# Patient Record
Sex: Female | Born: 2000 | Race: Black or African American | Hispanic: No | Marital: Single
Health system: Southern US, Community
[De-identification: ages and names within clinical notes are randomized; demographics above are authoritative.]

## PROBLEM LIST (undated history)

## (undated) DIAGNOSIS — J45909 Unspecified asthma, uncomplicated: Secondary | ICD-10-CM

## (undated) DIAGNOSIS — F909 Attention-deficit hyperactivity disorder, unspecified type: Secondary | ICD-10-CM

## (undated) DIAGNOSIS — F99 Mental disorder, not otherwise specified: Secondary | ICD-10-CM

## (undated) HISTORY — DX: Mental disorder, not otherwise specified: F99

## (undated) HISTORY — PX: TONSILLECTOMY: SUR1361

---
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
## 2018-03-22 ENCOUNTER — Emergency Department (HOSPITAL_COMMUNITY): Payer: Self-pay

## 2018-03-22 ENCOUNTER — Encounter (HOSPITAL_COMMUNITY): Payer: Self-pay | Admitting: Oncology

## 2018-03-22 ENCOUNTER — Emergency Department (HOSPITAL_COMMUNITY)
Admission: EM | Admit: 2018-03-22 | Discharge: 2018-03-22 | Disposition: A | Discharge status: Home / Self Care | Payer: Self-pay | Attending: Emergency Medicine | Admitting: Emergency Medicine

## 2018-03-22 ENCOUNTER — Other Ambulatory Visit: Payer: Self-pay

## 2018-03-22 DIAGNOSIS — Z7722 Contact with and (suspected) exposure to environmental tobacco smoke (acute) (chronic): Secondary | ICD-10-CM | POA: Insufficient documentation

## 2018-03-22 DIAGNOSIS — J4521 Mild intermittent asthma with (acute) exacerbation: Secondary | ICD-10-CM | POA: Insufficient documentation

## 2018-03-22 MED ORDER — ALBUTEROL SULFATE HFA 108 (90 BASE) MCG/ACT IN AERS
2.0000 | INHALATION_SPRAY | Freq: Once | RESPIRATORY_TRACT | Status: AC
  Administered 2018-03-22: 2 via RESPIRATORY_TRACT
  Filled 2018-03-22: qty 6.7

## 2018-03-22 MED ORDER — PREDNISONE 10 MG PO TABS: 20.0000 mg | ORAL_TABLET | Freq: Every day | ORAL | 0 refills | Status: AC
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
## 2018-03-22 NOTE — ED Notes (Signed)
Patient transported to X-ray 

## 2018-03-22 NOTE — Discharge Instructions (Signed)
Please read instructions below. Use your albuterol inhaler every 4-6 hours as needed for shortness of breath or wheezing.  Starting tomorrow, begin taking the prednisone, as prescribed, until it is gone. Follow up with your primary care provider. Return to the ER if you have shortness of breath not improved by your inhaler, or new or concerning symptoms.  

## 2018-03-22 NOTE — ED Notes (Signed)
Patient verbalizes understanding of medications and discharge instructions. No further questions at this time. VSS and patient ambulatory at discharge.   
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
## 2018-08-09 DIAGNOSIS — R0902 Hypoxemia: Secondary | ICD-10-CM | POA: Diagnosis not present

## 2018-08-09 DIAGNOSIS — J80 Acute respiratory distress syndrome: Secondary | ICD-10-CM | POA: Diagnosis not present

## 2018-08-09 DIAGNOSIS — R069 Unspecified abnormalities of breathing: Secondary | ICD-10-CM | POA: Diagnosis not present

## 2018-08-09 DIAGNOSIS — R0602 Shortness of breath: Secondary | ICD-10-CM | POA: Diagnosis not present

## 2018-09-25 DIAGNOSIS — R52 Pain, unspecified: Secondary | ICD-10-CM | POA: Diagnosis not present

## 2018-09-25 DIAGNOSIS — R0689 Other abnormalities of breathing: Secondary | ICD-10-CM | POA: Diagnosis not present

## 2018-09-25 DIAGNOSIS — J80 Acute respiratory distress syndrome: Secondary | ICD-10-CM | POA: Diagnosis not present

## 2018-09-25 DIAGNOSIS — R0902 Hypoxemia: Secondary | ICD-10-CM | POA: Diagnosis not present

## 2018-09-25 DIAGNOSIS — Z209 Contact with and (suspected) exposure to unspecified communicable disease: Secondary | ICD-10-CM | POA: Diagnosis not present

## 2018-11-19 ENCOUNTER — Encounter (HOSPITAL_COMMUNITY): Payer: Self-pay

## 2018-11-19 ENCOUNTER — Emergency Department (HOSPITAL_COMMUNITY)
Admission: EM | Admit: 2018-11-19 | Discharge: 2018-11-20 | Disposition: A | Discharge status: Home / Self Care | Payer: Self-pay | Attending: Emergency Medicine | Admitting: Emergency Medicine

## 2018-11-19 DIAGNOSIS — Z5321 Procedure and treatment not carried out due to patient leaving prior to being seen by health care provider: Secondary | ICD-10-CM | POA: Insufficient documentation

## 2018-11-19 DIAGNOSIS — J45909 Unspecified asthma, uncomplicated: Secondary | ICD-10-CM | POA: Insufficient documentation

## 2018-11-19 DIAGNOSIS — R0902 Hypoxemia: Secondary | ICD-10-CM | POA: Diagnosis not present

## 2018-11-19 DIAGNOSIS — R0602 Shortness of breath: Secondary | ICD-10-CM | POA: Diagnosis not present

## 2018-11-19 DIAGNOSIS — I10 Essential (primary) hypertension: Secondary | ICD-10-CM | POA: Diagnosis not present

## 2018-11-19 DIAGNOSIS — R000 Tachycardia, unspecified: Secondary | ICD-10-CM | POA: Diagnosis not present

## 2018-11-19 NOTE — ED Notes (Signed)
This tech saw patient leave room and exit lobby.

## 2018-11-19 NOTE — ED Triage Notes (Signed)
Pt comes via [facility], hx of asthma, wheezing in all lobes, PTA received duoneb, 2 gm mag and 125 solumedrol.

## 2018-12-09 ENCOUNTER — Emergency Department (HOSPITAL_COMMUNITY)
Admission: EM | Admit: 2018-12-09 | Discharge: 2018-12-09 | Disposition: A | Discharge status: Home / Self Care | Payer: Self-pay | Attending: Emergency Medicine | Admitting: Emergency Medicine

## 2018-12-09 ENCOUNTER — Other Ambulatory Visit: Payer: Self-pay

## 2018-12-09 ENCOUNTER — Encounter (HOSPITAL_COMMUNITY): Payer: Self-pay

## 2018-12-09 DIAGNOSIS — R457 State of emotional shock and stress, unspecified: Secondary | ICD-10-CM | POA: Diagnosis not present

## 2018-12-09 DIAGNOSIS — J45901 Unspecified asthma with (acute) exacerbation: Secondary | ICD-10-CM | POA: Insufficient documentation

## 2018-12-09 DIAGNOSIS — R069 Unspecified abnormalities of breathing: Secondary | ICD-10-CM | POA: Diagnosis not present

## 2018-12-09 DIAGNOSIS — Z7722 Contact with and (suspected) exposure to environmental tobacco smoke (acute) (chronic): Secondary | ICD-10-CM | POA: Insufficient documentation

## 2018-12-09 DIAGNOSIS — R0689 Other abnormalities of breathing: Secondary | ICD-10-CM | POA: Diagnosis not present

## 2018-12-09 DIAGNOSIS — Z79899 Other long term (current) drug therapy: Secondary | ICD-10-CM | POA: Insufficient documentation

## 2018-12-09 DIAGNOSIS — R0902 Hypoxemia: Secondary | ICD-10-CM | POA: Diagnosis not present

## 2018-12-09 MED ORDER — PREDNISONE 20 MG PO TABS: 60.0000 mg | ORAL_TABLET | Freq: Every day | ORAL | 0 refills | Status: DC

## 2018-12-09 MED ORDER — ALBUTEROL SULFATE HFA 108 (90 BASE) MCG/ACT IN AERS: 1.0000 | INHALATION_SPRAY | Freq: Once | RESPIRATORY_TRACT | Status: DC | PRN

## 2018-12-09 MED ORDER — ALBUTEROL SULFATE HFA 108 (90 BASE) MCG/ACT IN AERS
4.0000 | INHALATION_SPRAY | Freq: Once | RESPIRATORY_TRACT | Status: AC
  Administered 2018-12-09: 4 via RESPIRATORY_TRACT
  Filled 2018-12-09: qty 6.7

## 2018-12-09 MED ORDER — ALBUTEROL SULFATE HFA 108 (90 BASE) MCG/ACT IN AERS
8.0000 | INHALATION_SPRAY | Freq: Once | RESPIRATORY_TRACT | Status: AC
  Administered 2018-12-09: 8 via RESPIRATORY_TRACT
  Filled 2018-12-09: qty 6.7

## 2018-12-09 NOTE — ED Provider Notes (Signed)
[facility] DEPT Provider Note   CSN: [id number] Arrival date & time: 12/09/18  1042     History   Chief Complaint Chief Complaint  Patient presents with  . Asthma    HPI Amanda Glover is a 18 y.o. female.  She has a history of asthma and smokes.  She is complaining of an acute asthma exacerbation that started this morning.  She tried her mom's inhaler and went to work but she felt worse and so an ambulance was called.  She received IV Solu-Medrol prior to arrival, along with 2 g of magnesium and a DuoNeb.  She said she has had asthma flareups before and this is not the worst.  She denies any fever.     The history is provided by the patient and the EMS personnel.  Asthma This is a recurrent problem. The current episode started 3 to 5 hours ago. The problem occurs constantly. The problem has not changed since onset.Associated symptoms include chest pain and shortness of breath. Pertinent negatives include no abdominal pain and no headaches. The symptoms are aggravated by exertion. Nothing relieves the symptoms. She has tried nothing for the symptoms. The treatment provided no relief.    Past Medical History:  Diagnosis Date  . ADHD (attention deficit hyperactivity disorder)   . Asthma     There are no active problems to display for this patient.   No past surgical history on file.   OB History   No obstetric history on file.      Home Medications    Prior to Admission medications   Medication Sig Start Date End Date Taking? Authorizing Provider  albuterol (PROVENTIL HFA;VENTOLIN HFA) 108 (90 BASE) MCG/ACT inhaler Inhale 1-2 puffs into the lungs every 6 (six) hours as needed for wheezing or shortness of breath. 12/21/14   Gloriann Loan, PA-C  loratadine (CLARITIN) 10 MG tablet Take 1 tablet (10 mg total) by mouth daily. 03/18/15   Antonietta Breach, PA-C    Family History No family history on file.  Social History Social History   Tobacco  Use  . Smoking status: Passive Smoke Exposure - Never Smoker  . Smokeless tobacco: Never Used  Substance Use Topics  . Alcohol use: Not Currently  . Drug use: Not Currently     Allergies   Other   Review of Systems Review of Systems  Constitutional: Negative for fever.  HENT: Negative for sore throat.   Eyes: Negative for visual disturbance.  Respiratory: Positive for cough, chest tightness, shortness of breath and wheezing.   Cardiovascular: Positive for chest pain.  Gastrointestinal: Negative for abdominal pain.  Genitourinary: Negative for dysuria.  Musculoskeletal: Negative for neck pain.  Skin: Negative for rash.  Neurological: Negative for headaches.     Physical Exam Updated Vital Signs SpO2 (S) 100%   Physical Exam Vitals signs and nursing note reviewed.  Constitutional:      General: She is not in acute distress.    Appearance: She is well-developed.  HENT:     Head: Normocephalic and atraumatic.  Eyes:     Conjunctiva/sclera: Conjunctivae normal.  Neck:     Musculoskeletal: Neck supple.  Cardiovascular:     Rate and Rhythm: Regular rhythm. Tachycardia present.     Pulses: Normal pulses.     Heart sounds: No murmur.  Pulmonary:     Effort: Tachypnea, accessory muscle usage, prolonged expiration and respiratory distress present.     Breath sounds: Wheezing present.  Abdominal:  Palpations: Abdomen is soft.     Tenderness: There is no abdominal tenderness.  Musculoskeletal: Normal range of motion.     Right lower leg: No edema.     Left lower leg: No edema.  Skin:    General: Skin is warm and dry.     Capillary Refill: Capillary refill takes less than 2 seconds.  Neurological:     General: No focal deficit present.     Mental Status: She is alert.      ED Treatments / Results  Labs (all labs ordered are listed, but only abnormal results are displayed) Labs Reviewed - No data to display  EKG None  Radiology No results found.   Procedures Procedures (including critical care time)  Medications Ordered in ED Medications  albuterol (VENTOLIN HFA) 108 (90 Base) MCG/ACT inhaler 8 puff (8 puffs Inhalation Given 12/09/18 1119)  albuterol (VENTOLIN HFA) 108 (90 Base) MCG/ACT inhaler 4 puff (4 puffs Inhalation Given 12/09/18 1243)     Initial Impression / Assessment and Plan / ED Course  I have reviewed the triage vital signs and the nursing notes.  Pertinent labs & imaging results that were available during my care of the patient were reviewed by me and considered in my medical decision making (see chart for details).    Improved air movement after treatment. DDX asthma, ptx, pneumonia. sats remain good, more comfortable. Will discharge with inhaler and prednisone script. Recommended stopping smoking. Reviewed return instructions.      Final Clinical Impressions(s) / ED Diagnoses   Final diagnoses:  Moderate asthma with exacerbation, unspecified whether persistent    ED Discharge Orders         Ordered    predniSONE (DELTASONE) 20 MG tablet  Daily     12/09/18 1235           Hayden Rasmussen, MD 12/09/18 1927

## 2018-12-09 NOTE — ED Triage Notes (Signed)
Patient arrived via [facility] from home. Patient is AOx4 and ambulatory. Patient chief complaint is Asthma excacerbation that began this morning. Patient used inhaler this morning from mom, went to work and symptoms did not improve but increased. Patient does not have her own individual meds.   Patient received meds by EMS -125mg  Solumedrol IV

## 2018-12-09 NOTE — ED Notes (Signed)
Patient ambulated to restroom without complication or assistance. 

## 2018-12-09 NOTE — Discharge Instructions (Addendum)
You were seen in the emergency department for an asthma exacerbation.  Your symptoms improved with medication here.  Please continue to use the inhaler 2 puffs every 4-6 hours as needed.  4 more days of prednisone.  Please consider stopping smoking.  Return to the emergency department if any worsening symptoms.

## 2019-06-10 DIAGNOSIS — R000 Tachycardia, unspecified: Secondary | ICD-10-CM | POA: Diagnosis not present

## 2019-06-10 DIAGNOSIS — R069 Unspecified abnormalities of breathing: Secondary | ICD-10-CM | POA: Diagnosis not present

## 2019-06-10 DIAGNOSIS — R0602 Shortness of breath: Secondary | ICD-10-CM | POA: Diagnosis not present

## 2019-06-10 DIAGNOSIS — R0902 Hypoxemia: Secondary | ICD-10-CM | POA: Diagnosis not present

## 2019-06-10 DIAGNOSIS — R231 Pallor: Secondary | ICD-10-CM | POA: Diagnosis not present

## 2019-07-23 ENCOUNTER — Encounter (HOSPITAL_COMMUNITY): Payer: Self-pay | Admitting: Pediatrics

## 2019-07-23 ENCOUNTER — Emergency Department (HOSPITAL_COMMUNITY)
Admission: EM | Admit: 2019-07-23 | Discharge: 2019-07-23 | Disposition: A | Discharge status: Home / Self Care | Payer: Self-pay | Attending: Emergency Medicine | Admitting: Emergency Medicine

## 2019-07-23 ENCOUNTER — Emergency Department (HOSPITAL_COMMUNITY): Payer: Self-pay

## 2019-07-23 ENCOUNTER — Other Ambulatory Visit: Payer: Self-pay

## 2019-07-23 DIAGNOSIS — R0602 Shortness of breath: Secondary | ICD-10-CM | POA: Insufficient documentation

## 2019-07-23 DIAGNOSIS — Z3491 Encounter for supervision of normal pregnancy, unspecified, first trimester: Secondary | ICD-10-CM

## 2019-07-23 DIAGNOSIS — J4521 Mild intermittent asthma with (acute) exacerbation: Secondary | ICD-10-CM | POA: Insufficient documentation

## 2019-07-23 DIAGNOSIS — O26891 Other specified pregnancy related conditions, first trimester: Secondary | ICD-10-CM | POA: Insufficient documentation

## 2019-07-23 DIAGNOSIS — Z20822 Contact with and (suspected) exposure to covid-19: Secondary | ICD-10-CM | POA: Insufficient documentation

## 2019-07-23 DIAGNOSIS — Z3A00 Weeks of gestation of pregnancy not specified: Secondary | ICD-10-CM | POA: Insufficient documentation

## 2019-07-23 DIAGNOSIS — Z3201 Encounter for pregnancy test, result positive: Secondary | ICD-10-CM | POA: Insufficient documentation

## 2019-07-23 LAB — I-STAT BETA HCG BLOOD, ED (MC, WL, AP ONLY): I-stat hCG, quantitative: >2000 m[IU]/mL — ABNORMAL HIGH (ref <5)

## 2019-07-23 LAB — SARS CORONAVIRUS 2 BY RT PCR (HOSPITAL ORDER, PERFORMED IN ~~LOC~~ HOSPITAL LAB): SARS Coronavirus 2: NEGATIVE

## 2019-07-23 LAB — POC SARS CORONAVIRUS 2 AG -  ED: SARS Coronavirus 2 Ag: NEGATIVE

## 2019-07-23 MED ORDER — ALBUTEROL SULFATE HFA 108 (90 BASE) MCG/ACT IN AERS
8.0000 | INHALATION_SPRAY | Freq: Once | RESPIRATORY_TRACT | Status: AC
  Administered 2019-07-23: 8 via RESPIRATORY_TRACT
  Filled 2019-07-23: qty 6.7

## 2019-07-23 MED ORDER — PREDNISONE 20 MG PO TABS
20.0000 mg | ORAL_TABLET | Freq: Once | ORAL | Status: AC
  Administered 2019-07-23: 20 mg via ORAL
  Filled 2019-07-23: qty 1

## 2019-07-23 MED ORDER — AEROCHAMBER PLUS FLO-VU LARGE MISC: 1.0000 | Freq: Once | Status: DC

## 2019-07-23 MED ORDER — PREDNISONE 10 MG PO TABS: 20.0000 mg | ORAL_TABLET | Freq: Every day | ORAL | 0 refills | Status: AC

## 2019-07-23 MED ORDER — ALBUTEROL SULFATE HFA 108 (90 BASE) MCG/ACT IN AERS: 2.0000 | INHALATION_SPRAY | Freq: Four times a day (QID) | RESPIRATORY_TRACT | 0 refills | Status: DC | PRN

## 2019-07-23 NOTE — ED Triage Notes (Signed)
C/o shortness of breath x 2 days; along w/ cough. States RX xopenex inhaler has not helped.

## 2019-07-23 NOTE — ED Provider Notes (Signed)
[facility] EMERGENCY DEPARTMENT Provider Note   CSN: [id number] Arrival date & time: 07/23/19  0746     History Chief Complaint  Patient presents with  . Asthma    Amanda Glover is a 19 y.o. female with pertinent past medical history of asthma presents to the ER for evaluation of trouble breathing.  Onset yesterday.  Describes it as her chest feeling tight.  Has associated nasal congestion and scratchy throat.  Her coworker gave her an inhaler called Xopenex 2 months ago because she did not have her own inhaler and she has been using this.  States this inhaler does not seem to work as well as her usual albuterol inhaler.  Every time she takes a puff from her inhaler she starts coughing up phlegm.  Has some wheezing with deep inspiration.  Reports previous asthma flares.  She has had history of hospitalizations as a child for asthma.  Denies intubations.  Patient has not been vaccinated for Covid.  She denies fever, chest pain, loss of taste or smell, nausea, vomiting, abdominal pain, diarrhea, body aches, headaches.  No sick contacts.  No other associate symptoms.  No modifying factors. HPI     Past Medical History:  Diagnosis Date  . ADHD (attention deficit hyperactivity disorder)   . Asthma     There are no problems to display for this patient.   History reviewed. No pertinent surgical history.   OB History   No obstetric history on file.     No family history on file.  Social History   Tobacco Use  . Smoking status: Passive Smoke Exposure - Never Smoker  . Smokeless tobacco: Never Used  Vaping Use  . Vaping Use: Never used  Substance Use Topics  . Alcohol use: Not Currently  . Drug use: Not Currently    Home Medications Prior to Admission medications   Medication Sig Start Date End Date Taking? Authorizing Provider  albuterol (VENTOLIN HFA) 108 (90 Base) MCG/ACT inhaler Inhale 2 puffs into the lungs every 6 (six) hours as needed for wheezing  or shortness of breath. 07/23/19   Kinnie Feil, PA-C  loratadine (CLARITIN) 10 MG tablet Take 1 tablet (10 mg total) by mouth daily. Patient not taking: Reported on 12/09/2018 03/18/15   Antonietta Breach, PA-C  predniSONE (DELTASONE) 10 MG tablet Take 2 tablets (20 mg total) by mouth daily for 4 days. 07/23/19 07/27/19  Kinnie Feil, PA-C    Allergies    Other  Review of Systems   Review of Systems  HENT: Positive for rhinorrhea and sore throat.   Respiratory: Positive for cough, chest tightness, shortness of breath and wheezing.   All other systems reviewed and are negative.   Physical Exam Updated Vital Signs BP 115/82 (BP Location: Right Arm)   Pulse 76   Temp 98.5 F (36.9 C) (Oral)   Resp 15   Ht 5\' 3"  (1.6 m)   SpO2 98%   BMI 32.41 kg/m   Physical Exam Vitals and nursing note reviewed.  Constitutional:      General: She is not in acute distress.    Appearance: She is well-developed.     Comments: NAD.  HENT:     Head: Normocephalic and atraumatic.     Right Ear: External ear normal.     Left Ear: External ear normal.     Nose: Nose normal.     Comments: Nasal turbinates edematous, slightly erythematous.  Clear drainage noted.  Mouth/Throat:     Comments: Minimal erythema on oropharynx.  Otherwise uvula, tonsils and tonsillar pillars normal. Eyes:     General: No scleral icterus.    Conjunctiva/sclera: Conjunctivae normal.  Cardiovascular:     Rate and Rhythm: Normal rate and regular rhythm.     Heart sounds: Normal heart sounds. No murmur heard.   Pulmonary:     Effort: Pulmonary effort is normal.     Breath sounds: Wheezing present.     Comments: Diffuse inspiratory and expiratory wheezing in all fields.  Patient is in no respiratory distress, speaking in full sentences. Musculoskeletal:        General: No deformity. Normal range of motion.     Cervical back: Normal range of motion and neck supple.  Skin:    General: Skin is warm and dry.      Capillary Refill: Capillary refill takes less than 2 seconds.  Neurological:     Mental Status: She is alert and oriented to person, place, and time.  Psychiatric:        Behavior: Behavior normal.        Thought Content: Thought content normal.        Judgment: Judgment normal.     ED Results / Procedures / Treatments   Labs (all labs ordered are listed, but only abnormal results are displayed) Labs Reviewed  I-STAT BETA HCG BLOOD, ED ([facility], [facility], [facility] ONLY) - Abnormal; Notable for the following components:      Result Value   I-stat hCG, quantitative >2,000.0 (*)    All other components within normal limits  SARS CORONAVIRUS 2 BY RT PCR (HOSPITAL ORDER, [facility])  POC SARS CORONAVIRUS 2 AG -  ED    EKG None  Radiology DG Chest 2 View  Result Date: 07/23/2019 CLINICAL DATA:  asthma / shortness of breath x 2 days is EXAM: CHEST - 2 VIEW COMPARISON:  Chest radiograph 03/22/2018 FINDINGS: The heart size and mediastinal contours are within normal limits. Mild thickening of the interstitium bilaterally. No focal infiltrate. No pneumothorax or pleural effusion. The visualized skeletal structures are unremarkable. IMPRESSION: Mild thickening of the interstitium bilaterally as can be seen in reactive airways disease or viral infection. Electronically Signed   By: Audie Pinto M.D.   On: 07/23/2019 09:16    Procedures Procedures (including critical care time)  Medications Ordered in ED Medications  AeroChamber Plus Flo-Vu Large MISC 1 each (has no administration in time range)  albuterol (VENTOLIN HFA) 108 (90 Base) MCG/ACT inhaler 8 puff (8 puffs Inhalation Given 07/23/19 1118)  predniSONE (DELTASONE) tablet 20 mg (20 mg Oral Given 07/23/19 1257)    ED Course  I have reviewed the triage vital signs and the nursing notes.  Pertinent labs & imaging results that were available during my care of the patient were reviewed by me and considered in my medical  decision making (see chart for details).  Clinical Course as of Jul 22 1328  Fri Jul 23, 2019  1033 LMP March ended 23rd  History of irregular periods skipping 1-2 month No pelvic pain or vaginal bleeding, nausea, vomiting First pregnancy     [CG]    Clinical Course User Index [CG] Kinnie Feil, PA-C   MDM Rules/Calculators/A&P                          This patient complains of trouble breathing.  History of asthma as a child  requiring hospitalizations.  I obtained additional history from previous medical records available.  Nursing notes reviewed to obtain more history and assist with MDM.  Patient has been seen in the ER for similar complaints/asthma exacerbations.  Chief complain involves an extensive number of treatment options and is a complaint that carries with it a high risk of complications and morbidity and mortality.  The differential diagnosis includes viral lower respiratory infection including COVID-19, asthma exacerbation, pneumonia.  I ordered medication prednisone, albuterol 8 puffs.  ER work-up including chest x-ray, EKG, pregnancy test ordered at triage.  I ordered, reviewed and personally visualized and interpreted the above labs and/or imaging studies.    ER work up reveals chest x-ray shows reactive airway disease versus viral infection.  hCG is positive.  EKG is nonischemic.  We will add Covid testing.  1215: Rapid Covid is negative.  I have consulted OB pharmacist regarding patient's pregnancy status with asthma flare to assist with medication.  Prednisone has been shown to cross the placenta and cause some congenital malformations like cleft palate however risk is low and untreated asthma exacerbation will lead to worse outcomes.  Risks versus benefits discussed with patient and shared decision making was held.  Ultimately, I recommended prednisone low-dose 20 mg for 5 days as well as albuterol inhaler.  Patient does not have a primary care doctor or  OB/GYN to follow-up with feel if she will benefit from treatment.  She is in agreement with this.  Patient was reevaluated after medicines and albuterol and had improvement in wheezing.  Denies any symptoms.  Appropriate for discharge.  Return precautions discussed.  She was encouraged to follow-up with OB/GYN.  List of medicines that are safe during pregnancy given to her.  Final Clinical Impression(s) / ED Diagnoses Final diagnoses:  Mild intermittent asthma with acute exacerbation  First trimester pregnancy    Rx / DC Orders ED Discharge Orders         Ordered    predniSONE (DELTASONE) 10 MG tablet  Daily     Discontinue  Reprint     07/23/19 1326    albuterol (VENTOLIN HFA) 108 (90 Base) MCG/ACT inhaler  Every 6 hours PRN     Discontinue  Reprint     07/23/19 1327           Kinnie Feil, PA-C 07/23/19 1330    Charlesetta Shanks, MD 07/23/19 1651

## 2019-07-23 NOTE — Discharge Instructions (Addendum)
   You were seen in the ER for cough  Lab work was normal.  Chest x-ray showed either an asthma flare or a virus infection.  Your initial Covid test is negative but we are awaiting the confirmatory test.  This test will back in the next couple of hours.  You may take your MyChart account for the results.  Until then, be cautious about coughing or being around people without a mask.  We will treat your symptoms with low-dose prednisone for a total of 5 days.  Use albuterol inhaler 2 puffs every 4 hours.  You can purchase over-the-counter cough medicine to help with cough.  You can use over-the-counter Flonase nasal spray to help with nasal congestion.  You are approximately 10 to [redacted] weeks pregnant based on your last menstrual period, this is not an exact estimation.  You need to establish care with OB/GYN for routine OB care.  Start taking prenatal vitamins.  Avoid any alcohol, illicit drug use, cigarette use.  Avoid any medicines containing aspirin and ibuprofen.  You can take Tylenol (acetaminophen).  Type of Remedy: Allergy  Safe Medications to Take During Pregnancy  Diphenhydramine (Benadryl) Loratidine (Claritin) Cetirizine (Zyrtec) Type of Remedy: Cold and Flu  Safe Medications to Take During Pregnancy  Diphenhydramine (Benadryl)* Dextromethorphan (Robitussin)* Guaifenesin (Mucinex [plain]) * Vicks Vapor Rub mentholated cream Mentholated or non-mentholated cough drops (Sugar-free cough drops for gestational diabetes should not contain blends of herbs or aspartame) Pseudoephedrine ([Sudafed] after 1st trimester) Acetaminophen (Tylenol)* Saline nasal drops or spray Warm salt/water gargle *Note: Do not take the "SA" (Sustained Action) form of these drugs or the "Multi-Symptom" form of these drugs. Do not use Nyquil due to its high alcohol content.  Type of Remedy: Diarrhea  Safe Medications to Take During Pregnancy  Loperamide ([Imodium] after 1st trimester, for 24  hours only) Type of Remedy: Constipation  Safe Medications to Take During Pregnancy  Methylcellulose fiber (Citrucel) Docusate (Colace) psyllium (Fiberall, Metamucil) polycarbophil (FiberCon) polyethylene glycol (MiraLAX)* *Occasional use only  Type of Remedy: First Aid Ointment  Safe Medications to Take During Pregnancy  Bacitracin Neomycin/polymyxin B/bacitracin (Neosporin) Type of Remedy: Headache  Safe Medications to Take During Pregnancy  Acetaminophen (Tylenol) Type of Remedy: Heartburn  Safe Medications to Take During Pregnancy  Aluminum hydroxide/magnesium carbonate (Gaviscon)* Famotidine (Pepcid AC) Aluminum hydroxide/magnesium hydroxide (Maalox) Calcium carbonate/magnesium carbonate (Mylanta) Calcium carbonate (Titralac, Tums) Ranitidine (Zantac) *Occasional use only  Type of Remedy: Hemorrhoids  Safe Medications to Take During Pregnancy  Phenylephrine/mineral oil/petrolatum (Preparation H) Witch hazel (Tucks pads or ointment) Type of Remedy: Insect repellant  Safe Medications to Take During Pregnancy  N,N-diethyl-meta-toluamide (DEET) Type of Remedy: Nausea and Vomiting  Safe Medications to Take During Pregnancy  Diphenhydramine (Benadryl) Vitamin B6 Type of Remedy: Rashes  Safe Medications to Take During Pregnancy  Diphenhydramine cream (Benadryl) Hydrocortisone cream or ointment Oatmeal bath (Aveeno) Type of Remedy: Sleep  Safe Medications to Take During Pregnancy  Diphenhydramine (Unisom SleepGels, Benadryl) Type of Remedy: Yeast Infection  Safe Medications to Take During Pregnancy  Miconazole (Monistat) *Please note: No drug can be considered 100% safe to use during pregnancy.

## 2019-07-23 NOTE — ED Notes (Signed)
Pt was discharged from ED in NAD at 1330

## 2019-10-18 ENCOUNTER — Encounter: Payer: Self-pay | Admitting: Obstetrics and Gynecology

## 2019-10-19 ENCOUNTER — Encounter: Payer: Self-pay | Admitting: General Practice

## 2019-10-19 ENCOUNTER — Other Ambulatory Visit (HOSPITAL_COMMUNITY)
Admission: RE | Admit: 2019-10-19 | Discharge: 2019-10-19 | Disposition: A | Discharge status: Home / Self Care | Payer: Self-pay | Source: Ambulatory Visit | Attending: Obstetrics and Gynecology | Admitting: Obstetrics and Gynecology

## 2019-10-19 ENCOUNTER — Encounter: Payer: Self-pay | Admitting: Certified Nurse Midwife

## 2019-10-19 ENCOUNTER — Other Ambulatory Visit: Payer: Self-pay

## 2019-10-19 ENCOUNTER — Ambulatory Visit (INDEPENDENT_AMBULATORY_CARE_PROVIDER_SITE_OTHER): Payer: Self-pay | Admitting: Certified Nurse Midwife

## 2019-10-19 VITALS — BP 105/61 | HR 88 | Wt 174.4 lb

## 2019-10-19 DIAGNOSIS — J45909 Unspecified asthma, uncomplicated: Secondary | ICD-10-CM

## 2019-10-19 DIAGNOSIS — Z3A25 25 weeks gestation of pregnancy: Secondary | ICD-10-CM

## 2019-10-19 DIAGNOSIS — Z1331 Encounter for screening for depression: Secondary | ICD-10-CM

## 2019-10-19 DIAGNOSIS — Z3400 Encounter for supervision of normal first pregnancy, unspecified trimester: Secondary | ICD-10-CM | POA: Insufficient documentation

## 2019-10-19 DIAGNOSIS — D509 Iron deficiency anemia, unspecified: Secondary | ICD-10-CM

## 2019-10-19 DIAGNOSIS — O99519 Diseases of the respiratory system complicating pregnancy, unspecified trimester: Secondary | ICD-10-CM

## 2019-10-19 DIAGNOSIS — O99019 Anemia complicating pregnancy, unspecified trimester: Secondary | ICD-10-CM

## 2019-10-19 DIAGNOSIS — O99012 Anemia complicating pregnancy, second trimester: Secondary | ICD-10-CM

## 2019-10-19 DIAGNOSIS — O0932 Supervision of pregnancy with insufficient antenatal care, second trimester: Secondary | ICD-10-CM | POA: Insufficient documentation

## 2019-10-19 DIAGNOSIS — O99512 Diseases of the respiratory system complicating pregnancy, second trimester: Secondary | ICD-10-CM

## 2019-10-19 MED ORDER — PREPLUS 27-1 MG PO TABS: 1.0000 | ORAL_TABLET | Freq: Every day | ORAL | 6 refills | Status: AC

## 2019-10-19 MED ORDER — ALBUTEROL SULFATE HFA 108 (90 BASE) MCG/ACT IN AERS: 2.0000 | INHALATION_SPRAY | Freq: Four times a day (QID) | RESPIRATORY_TRACT | 2 refills | Status: DC | PRN

## 2019-10-19 NOTE — Patient Instructions (Signed)
Glucose Tolerance Test During Pregnancy Why am I having this test? The glucose tolerance test (GTT) is done to check how your body processes sugar (glucose). This is one of several tests used to diagnose diabetes that develops during pregnancy (gestational diabetes mellitus). Gestational diabetes is a temporary form of diabetes that some women develop during pregnancy. It usually occurs during the second trimester of pregnancy and goes away after delivery. Testing (screening) for gestational diabetes usually occurs between 24 and 28 weeks of pregnancy. You may have the GTT test after having a 1-hour glucose screening test if the results from that test indicate that you may have gestational diabetes. You may also have this test if:  You have a history of gestational diabetes.  You have a history of giving birth to very large babies or have experienced repeated fetal loss (stillbirth).  You have signs and symptoms of diabetes, such as: ? Changes in your vision. ? Tingling or numbness in your hands or feet. ? Changes in hunger, thirst, and urination that are not otherwise explained by your pregnancy. What is being tested? This test measures the amount of glucose in your blood at different times during a period of 3 hours. This indicates how well your body is able to process glucose. What kind of sample is taken?  Blood samples are required for this test. They are usually collected by inserting a needle into a blood vessel. How do I prepare for this test?  For 3 days before your test, eat normally. Have plenty of carbohydrate-rich foods.  Follow instructions from your health care provider about: ? Eating or drinking restrictions on the day of the test. You may be asked to not eat or drink anything other than water (fast) starting 8-10 hours before the test. ? Changing or stopping your regular medicines. Some medicines may interfere with this test. Tell a health care provider about:  All  medicines you are taking, including vitamins, herbs, eye drops, creams, and over-the-counter medicines.  Any blood disorders you have.  Any surgeries you have had.  Any medical conditions you have. What happens during the test? First, your blood glucose will be measured. This is referred to as your fasting blood glucose, since you fasted before the test. Then, you will drink a glucose solution that contains a certain amount of glucose. Your blood glucose will be measured again 1, 2, and 3 hours after drinking the solution. This test takes about 3 hours to complete. You will need to stay at the testing location during this time. During the testing period:  Do not eat or drink anything other than the glucose solution.  Do not exercise.  Do not use any products that contain nicotine or tobacco, such as cigarettes and e-cigarettes. If you need help stopping, ask your health care provider. The testing procedure may vary among health care providers and hospitals. How are the results reported? Your results will be reported as milligrams of glucose per deciliter of blood (mg/dL) or millimoles per liter (mmol/L). Your health care provider will compare your results to normal ranges that were established after testing a large group of people (reference ranges). Reference ranges may vary among labs and hospitals. For this test, common reference ranges are:  Fasting: less than 95-105 mg/dL (5.3-5.8 mmol/L).  1 hour after drinking glucose: less than 180-190 mg/dL (10.0-10.5 mmol/L).  2 hours after drinking glucose: less than 155-165 mg/dL (8.6-9.2 mmol/L).  3 hours after drinking glucose: 140-145 mg/dL (7.8-8.1 mmol/L). What do the   results mean? Results within reference ranges are considered normal, meaning that your glucose levels are well-controlled. If two or more of your blood glucose levels are high, you may be diagnosed with gestational diabetes. If only one level is high, your health care  provider may suggest repeat testing or other tests to confirm a diagnosis. Talk with your health care provider about what your results mean. Questions to ask your health care provider Ask your health care provider, or the department that is doing the test:  When will my results be ready?  How will I get my results?  What are my treatment options?  What other tests do I need?  What are my next steps? Summary  The glucose tolerance test (GTT) is one of several tests used to diagnose diabetes that develops during pregnancy (gestational diabetes mellitus). Gestational diabetes is a temporary form of diabetes that some women develop during pregnancy.  You may have the GTT test after having a 1-hour glucose screening test if the results from that test indicate that you may have gestational diabetes. You may also have this test if you have any symptoms or risk factors for gestational diabetes.  Talk with your health care provider about what your results mean. This information is not intended to replace advice given to you by your health care provider. Make sure you discuss any questions you have with your health care provider. Document Revised: 05/14/2018 Document Reviewed: 09/02/2016 Elsevier Patient Education  2020 Elsevier Inc.  

## 2019-10-19 NOTE — Addendum Note (Signed)
Addended by: Orlin Hilding on: 10/19/2019 11:31 AM   Modules accepted: Orders

## 2019-10-19 NOTE — Progress Notes (Signed)
History:   Amanda Glover is a 19 y.o. G1P0000 at [redacted]w[redacted]d by LMP being seen today for her first obstetrical visit.  Her obstetrical history is significant for late for prenatal care. Patient does intend to breast feed. Pregnancy history fully reviewed.  Patient reports no complaints.     HISTORY: OB History  Gravida Para Term Preterm AB Living  1 0 0 0 0 0  SAB TAB Ectopic Multiple Live Births  0 0 0 0 0    # Outcome Date GA Lbr Len/2nd Weight Sex Delivery Anes PTL Lv  1 Current             Patient is <41 yo - no pap needed at this time.   Past Medical History:  Diagnosis Date  . Asthma   . Mental disorder    Past Surgical History:  Procedure Laterality Date  . TONSILLECTOMY     Family History  Problem Relation Age of Onset  . Asthma Mother    Social History   Tobacco Use  . Smoking status: Passive Smoke Exposure - Never Smoker  . Smokeless tobacco: Never Used  Vaping Use  . Vaping Use: Never used  Substance Use Topics  . Alcohol use: Not Currently  . Drug use: Not Currently   Allergies  Allergen Reactions  . Other Swelling    Lima Beans - swelling lips   . Watermelon Flavor    No current outpatient medications on file prior to visit.   No current facility-administered medications on file prior to visit.    Review of Systems Pertinent items noted in HPI and remainder of comprehensive ROS otherwise negative. Physical Exam:   Vitals:   10/19/19 0950  BP: 105/61  Pulse: 88  Weight: 174 lb 6.4 oz (79.1 kg)   Fetal Heart Rate (bpm): 152 Uterus:  Fundal Height: 26 cm  Pelvic Exam: Perineum: no hemorrhoids, normal perineum   Vulva: normal external genitalia, no lesions   Bony Pelvis: average  System: General: well-developed, well-nourished female in no acute distress   Breasts:  normal appearance, no masses or tenderness bilaterally   Skin: normal coloration and turgor, no rashes   Neurologic: oriented, normal, negative, normal mood   Extremities:  normal strength, tone, and muscle mass, ROM of all joints is normal   HEENT PERRLA, extraocular movement intact and sclera clear   Mouth/Teeth mucous membranes moist, pharynx normal without lesions and dental hygiene good   Neck supple and no masses   Cardiovascular: regular rate and rhythm   Respiratory:  no respiratory distress, normal breath sounds   Abdomen: soft, gravid appropriate for gestational age, non-tender; bowel sounds normal; no masses,  no organomegaly    Assessment:    Pregnancy: G1P0000 Patient Active Problem List   Diagnosis Date Noted  . Late prenatal care affecting pregnancy in second trimester 10/19/2019  . Supervision of normal first pregnancy 10/19/2019  . Asthma affecting pregnancy, antepartum 10/19/2019     Plan:    1. Late prenatal care affecting pregnancy in second trimester - Care initiated at 25 weeks - Discussed with patient importance of prenatal care and follow ups, patient verbalizes understanding states she was nervous and in a state of shock but now is feeling better and happy about pregnancy   2. Encounter for supervision of low-risk first pregnancy, antepartum - Welcomed to practice and introduced self to patient  - congratulated on pregnancy  - Reviewed safety, visitor policy, reassurance about COVID-19 for pregnancy at this time. Discussed  possible changes to visits, including televisits, that may occur due to COVID-19.  The office remains open if pt needs to be seen and MAU is open 24 hours/day for OB emergencies. - Routine prenatal care - Anticipatory guidance on upcoming appointments with ultrasound - GC/Chlamydia probe amp ([place])not at [facility] - CBC/D/Plt+RPR+Rh+ABO+Rub Ab... - Culture, OB Urine - CHL AMB BABYSCRIPTS OPT IN - [place] MFM OB COMP + 14 WK; Future - Prenatal Vit-Fe Fumarate-FA (PREPLUS) 27-1 MG TABS; Take 1 tablet by mouth daily.  Dispense: 60 tablet; Refill: 6  3. Asthma affecting pregnancy,  antepartum - patient was seen in ED for asthma exaggeration in June, started on Albuterol inhaler  - Patient reports inhaler is working, last time needed was 4am this morning  - Patient reports that she needs refill, Rx sent to pharmacy of choice  - albuterol (VENTOLIN HFA) 108 (90 Base) MCG/ACT inhaler; Inhale 2 puffs into the lungs every 6 (six) hours as needed for wheezing or shortness of breath.  Dispense: 18 g; Refill: 2  4. [redacted] weeks gestation   Initial labs drawn. Rx prenatal vitamins sent to pharmacy  Problem list reviewed and updated. Genetic Screening discussed, NIPS: ordered. Too late for AFP  Ultrasound discussed; fetal anatomic survey: ordered. Anticipatory guidance about prenatal visits given including labs, ultrasounds, and testing. Discussed usage of Babys[employer]s and virtual visits as additional source of managing and completing prenatal visits in midst of coronavirus and pandemic.   Encouraged to complete MyChart Registration for her ability to review results, send requests, and have questions addressed.  The nature of [place] [facility] with multiple MDs and Advanced Practice Providers was explained to patient; also emphasized that residents, students are part of our team. Routine obstetric precautions reviewed. Encouraged to seek out care at office or emergency room [facility] preferred) for urgent and/or emergent concerns. Return in about 4 weeks (around 11/16/2019) for ROB/GTT.     Lajean Manes, [place] for [employer], [place]

## 2019-10-20 LAB — CBC/D/PLT+RPR+RH+ABO+RUB AB...
Antibody Screen: NEGATIVE
Basophils Absolute: 0.1 10*3/uL (ref 0.0–0.2)
Basos: 1 %
EOS (ABSOLUTE): 0.5 10*3/uL — ABNORMAL HIGH (ref 0.0–0.4)
Eos: 6 %
HCV Ab: <0.1 s/co ratio (ref 0.0–0.9)
HIV Screen 4th Generation wRfx: NONREACTIVE
Hematocrit: 30.6 % — ABNORMAL LOW (ref 34.0–46.6)
Hemoglobin: 10.1 g/dL — ABNORMAL LOW (ref 11.1–15.9)
Hepatitis B Surface Ag: NEGATIVE
Immature Grans (Abs): 0 10*3/uL (ref 0.0–0.1)
Immature Granulocytes: 0 %
Lymphocytes Absolute: 3.4 10*3/uL — ABNORMAL HIGH (ref 0.7–3.1)
Lymphs: 39 %
MCH: 30 pg (ref 26.6–33.0)
MCHC: 33 g/dL (ref 31.5–35.7)
MCV: 91 fL (ref 79–97)
Monocytes Absolute: 0.6 10*3/uL (ref 0.1–0.9)
Monocytes: 6 %
Neutrophils Absolute: 4.3 10*3/uL (ref 1.4–7.0)
Neutrophils: 48 %
Platelets: 240 10*3/uL (ref 150–450)
RBC: 3.37 x10E6/uL — ABNORMAL LOW (ref 3.77–5.28)
RDW: 12.8 % (ref 11.7–15.4)
RPR Ser Ql: NONREACTIVE
Rh Factor: POSITIVE
Rubella Antibodies, IGG: 3.84 index (ref >0.99)
WBC: 8.9 10*3/uL (ref 3.4–10.8)

## 2019-10-20 LAB — HCV INTERPRETATION

## 2019-10-20 LAB — GC/CHLAMYDIA PROBE AMP (~~LOC~~) NOT AT ARMC
Chlamydia: NEGATIVE
Comment: NEGATIVE
Comment: NORMAL
Neisseria Gonorrhea: NEGATIVE

## 2019-10-21 DIAGNOSIS — D509 Iron deficiency anemia, unspecified: Secondary | ICD-10-CM | POA: Insufficient documentation

## 2019-10-21 MED ORDER — FERROUS GLUCONATE 324 (38 FE) MG PO TABS: 324.0000 mg | ORAL_TABLET | Freq: Every day | ORAL | 2 refills | Status: DC

## 2019-10-21 NOTE — Addendum Note (Signed)
Addended by: Lajean Manes on: 10/21/2019 08:27 AM   Modules accepted: Orders

## 2019-11-02 ENCOUNTER — Other Ambulatory Visit: Payer: Self-pay | Admitting: Certified Nurse Midwife

## 2019-11-02 ENCOUNTER — Ambulatory Visit: Payer: Self-pay | Attending: Certified Nurse Midwife

## 2019-11-02 ENCOUNTER — Other Ambulatory Visit: Payer: Self-pay

## 2019-11-02 DIAGNOSIS — Z3400 Encounter for supervision of normal first pregnancy, unspecified trimester: Secondary | ICD-10-CM | POA: Insufficient documentation
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
## 2019-11-03 ENCOUNTER — Other Ambulatory Visit: Payer: Self-pay | Admitting: *Deleted

## 2019-11-03 DIAGNOSIS — D259 Leiomyoma of uterus, unspecified: Secondary | ICD-10-CM

## 2019-11-04 ENCOUNTER — Encounter: Payer: Self-pay | Admitting: *Deleted

## 2019-11-12 ENCOUNTER — Other Ambulatory Visit: Payer: Self-pay | Admitting: General Practice

## 2019-11-12 DIAGNOSIS — Z3403 Encounter for supervision of normal first pregnancy, third trimester: Secondary | ICD-10-CM

## 2019-11-16 ENCOUNTER — Encounter: Payer: Self-pay | Admitting: Student

## 2019-11-16 ENCOUNTER — Other Ambulatory Visit: Payer: Self-pay

## 2019-11-30 ENCOUNTER — Ambulatory Visit: Payer: Self-pay | Attending: Maternal & Fetal Medicine

## 2019-11-30 ENCOUNTER — Ambulatory Visit: Payer: Self-pay

## 2019-12-15 ENCOUNTER — Encounter: Payer: Self-pay | Admitting: *Deleted

## 2019-12-15 ENCOUNTER — Ambulatory Visit: Payer: Self-pay | Attending: Maternal & Fetal Medicine

## 2019-12-15 ENCOUNTER — Other Ambulatory Visit: Payer: Self-pay

## 2019-12-15 ENCOUNTER — Ambulatory Visit: Payer: Self-pay | Admitting: *Deleted

## 2019-12-15 DIAGNOSIS — O99013 Anemia complicating pregnancy, third trimester: Secondary | ICD-10-CM

## 2019-12-15 DIAGNOSIS — O0932 Supervision of pregnancy with insufficient antenatal care, second trimester: Secondary | ICD-10-CM | POA: Insufficient documentation

## 2019-12-15 DIAGNOSIS — O3410 Maternal care for benign tumor of corpus uteri, unspecified trimester: Secondary | ICD-10-CM | POA: Insufficient documentation

## 2019-12-15 DIAGNOSIS — J45909 Unspecified asthma, uncomplicated: Secondary | ICD-10-CM | POA: Insufficient documentation

## 2019-12-15 DIAGNOSIS — Z3A31 31 weeks gestation of pregnancy: Secondary | ICD-10-CM

## 2019-12-15 DIAGNOSIS — O99519 Diseases of the respiratory system complicating pregnancy, unspecified trimester: Secondary | ICD-10-CM

## 2019-12-15 DIAGNOSIS — D649 Anemia, unspecified: Secondary | ICD-10-CM

## 2019-12-15 DIAGNOSIS — D259 Leiomyoma of uterus, unspecified: Secondary | ICD-10-CM | POA: Insufficient documentation

## 2019-12-15 DIAGNOSIS — O99513 Diseases of the respiratory system complicating pregnancy, third trimester: Secondary | ICD-10-CM

## 2020-01-26 ENCOUNTER — Encounter: Payer: Self-pay | Admitting: *Deleted

## 2020-02-02 ENCOUNTER — Other Ambulatory Visit: Payer: Self-pay

## 2020-02-02 ENCOUNTER — Encounter: Payer: Self-pay | Admitting: Obstetrics & Gynecology

## 2020-02-02 ENCOUNTER — Encounter: Payer: Self-pay | Admitting: Family Medicine

## 2020-02-02 ENCOUNTER — Telehealth: Payer: Self-pay | Admitting: Family Medicine

## 2020-02-02 NOTE — Telephone Encounter (Signed)
Attempted to reach patient about her missed appointment. Was not able to leave a voicemail message because her mailbox was not setup.

## 2020-02-03 ENCOUNTER — Telehealth: Payer: Self-pay | Admitting: Family Medicine

## 2020-02-03 NOTE — Telephone Encounter (Signed)
Second attempt to reach patient about her missed OB appointment. Left a voicemail message for her to call the office to get rescheduled.

## 2020-02-05 NOTE — L&D Delivery Note (Addendum)
OB/GYN Faculty Practice Delivery Note  Amanda Glover is a 20 y.o. G1P0000 s/p vaginal delivery at [redacted]w[redacted]d. She was admitted for PROM.   ROM: 15h 47m with moderate meconium fluid GBS Status: Negative Maximum Maternal Temperature: 99.9 F  Labor Progress: . Category 2 tracing with prolonged late decelerations. Low dose pitocin started, progressed to complete dilation.   Delivery Date/Time: 02/12/2020 at 0717 Delivery: Called to room and patient was complete and pushing. Head delivered LOA. No nuchal cord present. Shoulder and body delivered in usual fashion. Infant with spontaneous cry, placed on mother's abdomen, dried and stimulated. Cord clamped x 2 after 1-minute delay, and cut by FOB. Cord blood drawn. Placenta delivered spontaneously, intact, with 3-vessel cord. Fundus firm with massage and Pitocin. Labia, perineum, vagina, and cervix inspected, no laceration found.   Placenta: Spontaneous, intact on 11/57/26 at 2035 Complications: none Lacerations: none EBL: 90 mL Analgesia: Epidural  Postpartum Planning [x]  transfer orders to MB [x]  discharge summary started & shared [x]  message to sent to schedule follow-up  [x]  lists updated [x]  vaccines UTD  Infant: Boy  APGARs 8/9  3320 g   Bluford Main, [place] for [place] 02/12/2020, 8:27 AM  Midwife attestation: I was gloved and present for delivery in its entirety and I agree with the above resident's note. NICU team present for delivery given thick meconium and multiple prolonged decelerations during labor. Infant vigorous at delivery and remained on mother's abdomen.  Fatima Blank, CNM 9:24 AM

## 2020-02-11 ENCOUNTER — Other Ambulatory Visit: Payer: Self-pay

## 2020-02-11 ENCOUNTER — Inpatient Hospital Stay (HOSPITAL_COMMUNITY): Payer: Medicaid Other | Admitting: Anesthesiology

## 2020-02-11 ENCOUNTER — Inpatient Hospital Stay (HOSPITAL_COMMUNITY)
Admission: AD | Admit: 2020-02-11 | Discharge: 2020-02-14 | DRG: 805 | Disposition: A | Discharge status: Home / Self Care | Payer: Medicaid Other | Attending: Obstetrics & Gynecology | Admitting: Obstetrics & Gynecology

## 2020-02-11 ENCOUNTER — Encounter (HOSPITAL_COMMUNITY): Payer: Self-pay | Admitting: Obstetrics and Gynecology

## 2020-02-11 DIAGNOSIS — J45909 Unspecified asthma, uncomplicated: Secondary | ICD-10-CM | POA: Diagnosis not present

## 2020-02-11 DIAGNOSIS — O9902 Anemia complicating childbirth: Secondary | ICD-10-CM | POA: Diagnosis present

## 2020-02-11 DIAGNOSIS — Z7722 Contact with and (suspected) exposure to environmental tobacco smoke (acute) (chronic): Secondary | ICD-10-CM | POA: Diagnosis present

## 2020-02-11 DIAGNOSIS — O4202 Full-term premature rupture of membranes, onset of labor within 24 hours of rupture: Secondary | ICD-10-CM | POA: Diagnosis not present

## 2020-02-11 DIAGNOSIS — Z3400 Encounter for supervision of normal first pregnancy, unspecified trimester: Secondary | ICD-10-CM

## 2020-02-11 DIAGNOSIS — O9852 Other viral diseases complicating childbirth: Secondary | ICD-10-CM | POA: Diagnosis present

## 2020-02-11 DIAGNOSIS — D509 Iron deficiency anemia, unspecified: Secondary | ICD-10-CM | POA: Diagnosis not present

## 2020-02-11 DIAGNOSIS — O9952 Diseases of the respiratory system complicating childbirth: Secondary | ICD-10-CM | POA: Diagnosis present

## 2020-02-11 DIAGNOSIS — O471 False labor at or after 37 completed weeks of gestation: Secondary | ICD-10-CM

## 2020-02-11 DIAGNOSIS — U071 COVID-19: Secondary | ICD-10-CM

## 2020-02-11 DIAGNOSIS — O4292 Full-term premature rupture of membranes, unspecified as to length of time between rupture and onset of labor: Secondary | ICD-10-CM | POA: Diagnosis not present

## 2020-02-11 DIAGNOSIS — O0932 Supervision of pregnancy with insufficient antenatal care, second trimester: Secondary | ICD-10-CM

## 2020-02-11 DIAGNOSIS — Z3A39 39 weeks gestation of pregnancy: Secondary | ICD-10-CM | POA: Diagnosis not present

## 2020-02-11 LAB — WET PREP, GENITAL
Clue Cells Wet Prep HPF POC: NONE SEEN
Sperm: NONE SEEN
Trich, Wet Prep: NONE SEEN
Yeast Wet Prep HPF POC: NONE SEEN

## 2020-02-11 LAB — URINALYSIS, ROUTINE W REFLEX MICROSCOPIC
Bilirubin Urine: NEGATIVE
Glucose, UA: NEGATIVE mg/dL
Ketones, ur: 5 mg/dL — AB
Leukocytes,Ua: NEGATIVE
Nitrite: NEGATIVE
Protein, ur: NEGATIVE mg/dL
Specific Gravity, Urine: 1.016 (ref 1.005–1.030)
pH: 6 (ref 5.0–8.0)

## 2020-02-11 LAB — CBC
HCT: 31.9 % — ABNORMAL LOW (ref 36.0–46.0)
Hemoglobin: 9.6 g/dL — ABNORMAL LOW (ref 12.0–15.0)
MCH: 24.1 pg — ABNORMAL LOW (ref 26.0–34.0)
MCHC: 30.1 g/dL (ref 30.0–36.0)
MCV: 80.2 fL (ref 80.0–100.0)
Platelets: 257 10*3/uL (ref 150–400)
RBC: 3.98 MIL/uL (ref 3.87–5.11)
RDW: 15.3 % (ref 11.5–15.5)
WBC: 6.3 10*3/uL (ref 4.0–10.5)
nRBC: 0 % (ref 0.0–0.2)

## 2020-02-11 LAB — RESP PANEL BY RT-PCR (FLU A&B, COVID) ARPGX2
Influenza A by PCR: NEGATIVE
Influenza B by PCR: NEGATIVE
SARS Coronavirus 2 by RT PCR: POSITIVE — AB

## 2020-02-11 LAB — GROUP B STREP BY PCR: Group B strep by PCR: NEGATIVE

## 2020-02-11 LAB — TYPE AND SCREEN
ABO/RH(D): B POS
Antibody Screen: NEGATIVE

## 2020-02-11 LAB — HEMOGLOBIN A1C
Hgb A1c MFr Bld: 5.6 % (ref 4.8–5.6)
Mean Plasma Glucose: 114.02 mg/dL

## 2020-02-11 MED ORDER — LACTATED RINGERS IV SOLN
500.0000 mL | Freq: Once | INTRAVENOUS | Status: AC
  Administered 2020-02-11: 500 mL via INTRAVENOUS

## 2020-02-11 MED ORDER — SOD CITRATE-CITRIC ACID 500-334 MG/5ML PO SOLN
30.0000 mL | ORAL | Status: DC | PRN
  Filled 2020-02-11: qty 15

## 2020-02-11 MED ORDER — SODIUM CHLORIDE (PF) 0.9 % IJ SOLN
INTRAMUSCULAR | Status: DC | PRN
  Administered 2020-02-11: 12 mL/h via EPIDURAL

## 2020-02-11 MED ORDER — LACTATED RINGERS IV SOLN: INTRAVENOUS | Status: DC

## 2020-02-11 MED ORDER — EPHEDRINE 5 MG/ML INJ: 10.0000 mg | INTRAVENOUS | Status: DC | PRN

## 2020-02-11 MED ORDER — ACETAMINOPHEN 325 MG PO TABS: 650.0000 mg | ORAL_TABLET | ORAL | Status: DC | PRN

## 2020-02-11 MED ORDER — PHENYLEPHRINE 40 MCG/ML (10ML) SYRINGE FOR IV PUSH (FOR BLOOD PRESSURE SUPPORT)
80.0000 ug | PREFILLED_SYRINGE | INTRAVENOUS | Status: DC | PRN
  Filled 2020-02-11: qty 10

## 2020-02-11 MED ORDER — FENTANYL CITRATE (PF) 100 MCG/2ML IJ SOLN
100.0000 ug | INTRAMUSCULAR | Status: DC | PRN
  Administered 2020-02-11: 100 ug via INTRAVENOUS
  Filled 2020-02-11: qty 2

## 2020-02-11 MED ORDER — ONDANSETRON HCL 4 MG/2ML IJ SOLN
4.0000 mg | Freq: Four times a day (QID) | INTRAMUSCULAR | Status: DC | PRN
  Administered 2020-02-12: 4 mg via INTRAVENOUS
  Filled 2020-02-11: qty 2

## 2020-02-11 MED ORDER — OXYCODONE-ACETAMINOPHEN 5-325 MG PO TABS: 1.0000 | ORAL_TABLET | ORAL | Status: DC | PRN

## 2020-02-11 MED ORDER — OXYTOCIN BOLUS FROM INFUSION
333.0000 mL | Freq: Once | INTRAVENOUS | Status: AC
  Administered 2020-02-12: 333 mL via INTRAVENOUS

## 2020-02-11 MED ORDER — LIDOCAINE HCL (PF) 1 % IJ SOLN
INTRAMUSCULAR | Status: DC | PRN
  Administered 2020-02-11: 5 mL via EPIDURAL

## 2020-02-11 MED ORDER — DIPHENHYDRAMINE HCL 50 MG/ML IJ SOLN: 12.5000 mg | INTRAMUSCULAR | Status: DC | PRN

## 2020-02-11 MED ORDER — PHENYLEPHRINE 40 MCG/ML (10ML) SYRINGE FOR IV PUSH (FOR BLOOD PRESSURE SUPPORT)
80.0000 ug | PREFILLED_SYRINGE | INTRAVENOUS | Status: DC | PRN
  Administered 2020-02-12 (×2): 80 ug via INTRAVENOUS

## 2020-02-11 MED ORDER — LACTATED RINGERS IV SOLN
500.0000 mL | INTRAVENOUS | Status: DC | PRN
  Administered 2020-02-11: 500 mL via INTRAVENOUS
  Administered 2020-02-12: 1000 mL via INTRAVENOUS

## 2020-02-11 MED ORDER — FENTANYL-BUPIVACAINE-NACL 0.5-0.125-0.9 MG/250ML-% EP SOLN
12.0000 mL/h | EPIDURAL | Status: DC | PRN
Start: 2020-02-11 — End: 2020-02-12
  Filled 2020-02-11: qty 250

## 2020-02-11 MED ORDER — LIDOCAINE HCL (PF) 1 % IJ SOLN: 30.0000 mL | INTRAMUSCULAR | Status: DC | PRN

## 2020-02-11 MED ORDER — TERBUTALINE SULFATE 1 MG/ML IJ SOLN
INTRAMUSCULAR | Status: AC
  Administered 2020-02-11: 0.25 mg
  Filled 2020-02-11: qty 1

## 2020-02-11 MED ORDER — OXYTOCIN-SODIUM CHLORIDE 30-0.9 UT/500ML-% IV SOLN
2.5000 [IU]/h | INTRAVENOUS | Status: DC
  Filled 2020-02-11: qty 500

## 2020-02-11 MED ORDER — TERBUTALINE SULFATE 1 MG/ML IJ SOLN
0.2500 mg | Freq: Once | INTRAMUSCULAR | Status: AC
  Administered 2020-02-11: 0.25 mg via SUBCUTANEOUS

## 2020-02-11 MED ORDER — OXYCODONE-ACETAMINOPHEN 5-325 MG PO TABS: 2.0000 | ORAL_TABLET | ORAL | Status: DC | PRN

## 2020-02-11 NOTE — Progress Notes (Signed)
Amanda Glover is a 20 y.o. G1P0000 at [redacted]w[redacted]d by second trimester ultrasound admitted for rupture of membranes   Subjective: Pt comfortable with epidural. Family member in room for support.  Objective: BP 116/74   Pulse 81   Temp 97.9 F (36.6 C) (Oral)   Resp 16   Ht 5\' 4"  (1.626 m)   Wt 90.7 kg   LMP 04/27/2019 (Exact Date)   SpO2 100%   BMI 34.33 kg/m  No intake/output data recorded. No intake/output data recorded.  FHT:  FHR: 125 bpm, variability: moderate,  accelerations:  Abscent,  decelerations:  Present prolonged decels x 2-4 minutes occuring repeatedly, with moderate variability and even 10 x 10 accles in between.   UC:   Irregular, difficult to assess with Toco at time of evaluation SVE:   Dilation: 5.5 Effacement (%): 80 Station: 0 Exam by:: Jasmine SCM FSE placed unsuccessfully x 1 by Bluford Main, SNM, and second FSE also unsuccessful by CNM.  Dr Roselie Awkward to room to evaluate, FHT heard by external monitor at 115 with 125 during scalp stimulation, and FSE placed successfully by Dr Roselie Awkward.  IUPC placed by Bluford Main, SNM. Pt tolerated procedures well.  Labs: Lab Results  Component Value Date   WBC 6.3 02/11/2020   HGB 9.6 (L) 02/11/2020   HCT 31.9 (L) 02/11/2020   MCV 80.2 02/11/2020   PLT 257 02/11/2020    Assessment / Plan: Spontaneous labor, progressing normally  Labor: Progressing normally Preeclampsia:  n/a Fetal Wellbeing:  Category II Pain Control:  Epidural I/D:  GBS neg Anticipated MOD:  undetermined  Fatima Blank 02/11/2020, 10:39 PM

## 2020-02-11 NOTE — Anesthesia Preprocedure Evaluation (Signed)
Anesthesia Evaluation  Patient identified by MRN, date of birth, ID band Patient awake    Reviewed: Allergy & Precautions, NPO status , Patient's Chart, lab work & pertinent test results  Airway Mallampati: II  TM Distance: >3 FB Neck ROM: Full    Dental no notable dental hx. (+) Teeth Intact, Dental Advisory Given   Pulmonary asthma ,  Covid +   Pulmonary exam normal breath sounds clear to auscultation       Cardiovascular Exercise Tolerance: Good negative cardio ROS Normal cardiovascular exam Rhythm:Regular Rate:Normal     Neuro/Psych PSYCHIATRIC DISORDERS negative neurological ROS     GI/Hepatic negative GI ROS, Neg liver ROS,   Endo/Other  negative endocrine ROS  Renal/GU negative Renal ROS     Musculoskeletal negative musculoskeletal ROS (+)   Abdominal   Peds  Hematology  (+) anemia , hgb 9.6 Plt 257   Anesthesia Other Findings   Reproductive/Obstetrics (+) Pregnancy                             Anesthesia Physical Anesthesia Plan  ASA: III  Anesthesia Plan: Epidural   Post-op Pain Management:    Induction:   PONV Risk Score and Plan:   Airway Management Planned:   Additional Equipment:   Intra-op Plan:   Post-operative Plan:   Informed Consent: I have reviewed the patients History and Physical, chart, labs and discussed the procedure including the risks, benefits and alternatives for the proposed anesthesia with the patient or authorized representative who has indicated his/her understanding and acceptance.       Plan Discussed with:   Anesthesia Plan Comments: (39.5 wk G1P0  for LEA)        Anesthesia Quick Evaluation

## 2020-02-11 NOTE — Anesthesia Procedure Notes (Signed)
Epidural Patient location during procedure: OB Start time: 02/11/2020 8:31 PM End time: 02/11/2020 8:47 PM  Staffing Anesthesiologist: Barnet Glasgow, MD Performed: anesthesiologist   Preanesthetic Checklist Completed: patient identified, IV checked, site marked, risks and benefits discussed, surgical consent, monitors and equipment checked, pre-op evaluation and timeout performed  Epidural Patient position: sitting Prep: DuraPrep and site prepped and draped Patient monitoring: continuous pulse ox and blood pressure Approach: midline Location: L3-L4 Injection technique: LOR air  Needle:  Needle type: Tuohy  Needle gauge: 17 G Needle length: 9 cm and 9 Needle insertion depth: 5 cm cm Catheter type: closed end flexible Catheter size: 19 Gauge Catheter at skin depth: 12 cm Test dose: negative  Assessment Events: blood not aspirated, injection not painful, no injection resistance, no paresthesia and negative IV test  Additional Notes Patient identified. Risks/Benefits/Options discussed with patient including but not limited to bleeding, infection, nerve damage, paralysis, failed block, incomplete pain control, headache, blood pressure changes, nausea, vomiting, reactions to medication both or allergic, itching and postpartum back pain. Confirmed with bedside nurse the patient's most recent platelet count. Confirmed with patient that they are not currently taking any anticoagulation, have any bleeding history or any family history of bleeding disorders. Patient expressed understanding and wished to proceed. All questions were answered. Sterile technique was used throughout the entire procedure. Please see nursing notes for vital signs. Test dose was given through epidural needle and negative prior to continuing to dose epidural or start infusion. Warning signs of high block given to the patient including shortness of breath, tingling/numbness in hands, complete motor block, or any  concerning symptoms with instructions to call for help. Patient was given instructions on fall risk and not to get out of bed. All questions and concerns addressed with instructions to call with any issues. 1 Attempt (S) . Patient tolerated procedure well.

## 2020-02-11 NOTE — H&P (Addendum)
OBSTETRIC ADMISSION HISTORY AND PHYSICAL  Amanda Glover is a 20 y.o. female G1P0000 with IUP at [redacted]w[redacted]d presenting for PROM @1550 . She reports +FMs. No VB, blurry vision, headaches, peripheral edema, or RUQ pain. She plans on breastfeeding. She is unsure on birth control.  Dating: By 25w [place] --->  Estimated Date of Delivery: 02/13/20  Sono:    @[redacted]w[redacted]d , normal anatomy, ceph presentation, 1703g, 28%ile, EFW 3'12  Prenatal History/Complications: - late PNC, 1 visit - anemia - asthma  Past Medical History: Past Medical History:  Diagnosis Date  . Asthma   . Mental disorder     Past Surgical History: Past Surgical History:  Procedure Laterality Date  . TONSILLECTOMY      Obstetrical History: OB History    Gravida  1   Para      Term      Preterm      AB  0   Living        SAB  0   IAB  0   Ectopic  0   Multiple      Live Births              Social History: Social History   Socioeconomic History  . Marital status: Single    Spouse name: Not on file  . Number of children: Not on file  . Years of education: Not on file  . Highest education level: Not on file  Occupational History  . Not on file  Tobacco Use  . Smoking status: Passive Smoke Exposure - Never Smoker  . Smokeless tobacco: Never Used  Vaping Use  . Vaping Use: Never used  Substance and Sexual Activity  . Alcohol use: Not Currently  . Drug use: Not Currently  . Sexual activity: Yes    Birth control/protection: None  Other Topics Concern  . Not on file  Social History Narrative  . Not on file   Social Determinants of Health   Financial Resource Strain: Not on file  Food Insecurity: Not on file  Transportation Needs: Not on file  Physical Activity: Not on file  Stress: Not on file  Social Connections: Not on file    Family History: Family History  Problem Relation Age of Onset  . Asthma Mother     Allergies: Allergies  Allergen Reactions  . Other Swelling    [place] Beans  - swelling lips   . Watermelon Flavor     Medications Prior to Admission  Medication Sig Dispense Refill Last Dose  . Prenatal Vit-Fe Fumarate-FA (PREPLUS) 27-1 MG TABS Take 1 tablet by mouth daily. 60 tablet 6 Past Week at Unknown time  . albuterol (VENTOLIN HFA) 108 (90 Base) MCG/ACT inhaler Inhale 2 puffs into the lungs every 6 (six) hours as needed for wheezing or shortness of breath. 18 g 2 More than a month at Unknown time  . ferrous gluconate (FERGON) 324 MG tablet Take 1 tablet (324 mg total) by mouth daily with breakfast. 60 tablet 2      Review of Systems:  All systems reviewed and negative except as stated in HPI  PE: Blood pressure 115/82, pulse 81, temperature 98.7 F (37.1 C), resp. rate 18, height 5\' 4"  (1.626 m), weight 91.1 kg, last menstrual period 04/27/2019. General appearance: alert, cooperative and no distress Lungs: regular rate and effort Heart: regular rate  Abdomen: soft, non-tender Extremities: Homans sign is negative, no sign of DVT EFM: 120 bpm, mod variability, + accels, no decels Toco: irregular Dilation:  1.5 Effacement (%): 70 Station: -3 Exam by:: Therisa Doyne, RN  Prenatal labs: ABO, Rh: --/--/PENDING (01/07 1619) Antibody: PENDING (01/07 1619) Rubella: 3.84 (09/14 1028) RPR: Non Reactive (09/14 1028)  HBsAg: Negative (09/14 1028)  HIV: Non Reactive (09/14 1028)  GBS:  pending 2 hr GTT not done  Prenatal Transfer Tool  Maternal Diabetes: No Genetic Screening: Normal Maternal Ultrasounds/Referrals: Normal Fetal Ultrasounds or other Referrals:  None Maternal Substance Abuse:  No Significant Maternal Medications:  None Significant Maternal Lab Results: None  Results for orders placed or performed during the hospital encounter of 02/11/20 (from the past 24 hour(s))  Wet prep, genital   Collection Time: 02/11/20  1:53 PM   Specimen: PATH Cytology Cervicovaginal Ancillary Only  Result Value Ref Range   Yeast Wet Prep HPF POC NONE  SEEN NONE SEEN   Trich, Wet Prep NONE SEEN NONE SEEN   Clue Cells Wet Prep HPF POC NONE SEEN NONE SEEN   WBC, Wet Prep HPF POC MODERATE (A) NONE SEEN   Sperm NONE SEEN   Urinalysis, Routine w reflex microscopic PATH Cytology Cervicovaginal Ancillary Only   Collection Time: 02/11/20  1:53 PM  Result Value Ref Range   Color, Urine AMBER (A) YELLOW   APPearance HAZY (A) CLEAR   Specific Gravity, Urine 1.016 1.005 - 1.030   pH 6.0 5.0 - 8.0   Glucose, UA NEGATIVE NEGATIVE mg/dL   Hgb urine dipstick SMALL (A) NEGATIVE   Bilirubin Urine NEGATIVE NEGATIVE   Ketones, ur 5 (A) NEGATIVE mg/dL   Protein, ur NEGATIVE NEGATIVE mg/dL   Nitrite NEGATIVE NEGATIVE   Leukocytes,Ua NEGATIVE NEGATIVE   RBC / HPF 0-5 0 - 5 RBC/hpf   WBC, UA 0-5 0 - 5 WBC/hpf   Bacteria, UA RARE (A) NONE SEEN   Squamous Epithelial / LPF 11-20 0 - 5   Mucus PRESENT   Type and screen [place]   Collection Time: 02/11/20  4:19 PM  Result Value Ref Range   ABO/RH(D) PENDING    Antibody Screen PENDING    Sample Expiration      02/14/2020,2359 Performed at [facility], [address] [place], [place] [address]     Patient Active Problem List   Diagnosis Date Noted  . Indication for care in labor or delivery 02/11/2020  . Iron deficiency anemia during pregnancy 10/21/2019  . Late prenatal care affecting pregnancy in second trimester 10/19/2019  . Supervision of normal first pregnancy 10/19/2019  . Asthma affecting pregnancy, antepartum 10/19/2019   Assessment: Amanda Glover is a 20 y.o. G1P0000 at [redacted]w[redacted]d here for PROM at term  1. Labor: latent 2. FWB: Cat I 3. Pain: analgesia prn 4. GBS: pending   Plan: Admit to LD Expectant mngt for now Anticipate SVD   Julianne Handler, CNM  02/11/2020, 5:13 PM

## 2020-02-11 NOTE — Progress Notes (Signed)
FHR decelerations persist with contractions. Contractions Q 1.5-5 minutes, MVUs 100-120.  Terbutaline 0.25 mg [place] given by RN at this time.

## 2020-02-11 NOTE — Progress Notes (Signed)
Labor Progress Note Amanda Glover is a 20 y.o. G1P0000 at [redacted]w[redacted]d presented for PROM at term. Covid positive, asymptomatic.  S:  Feeling more regular ctx, painful. States IV pain med didn't help.  O:  BP 128/82   Pulse 99   Temp 99 F (37.2 C) (Oral)   Resp 18   Ht 5\' 4"  (1.626 m)   Wt 90.7 kg   LMP 04/27/2019 (Exact Date)   BMI 34.33 kg/m  EFM: baseline 135 bpm/ mod variability/ no accels/ occ variable decels  Toco/IUPC: 2-5 SVE: Dilation: 3 Effacement (%): 80 Cervical Position: Posterior Station: -3 Presentation: Vertex Exam by:: [name] cnm Mod MSF  A/P: 20 y.o. G1P0000 [redacted]w[redacted]d  1. Labor: latent 2. FWB: Cat II 3. Pain: analgesia/anesthesia prn  Expectant mngt. Anticipate labor progression and SVD.  Julianne Handler, CNM 6:57 PM

## 2020-02-11 NOTE — MAU Provider Note (Cosign Needed)
S: Ms. Danyelle Brookover is a 20 y.o. G1P0000 at [redacted]w[redacted]d  who presents to MAU today complaining of irregular contractions  since 5am this morning. She denies vaginal bleeding. She endorses LOF - amber colored discharge in one large gush this morning and then small amounts in her underwear today. She reports normal fetal movement.    O: BP (!) 107/51   Pulse 78   Temp (!) 97.3 F (36.3 C) (Oral)   Resp 17   Ht 5\' 4"  (1.626 m)   Wt 200 lb (90.7 kg)   LMP 04/27/2019 (Exact Date)   SpO2 98%   BMI 34.33 kg/m  GENERAL: Well-developed, well-nourished female in no acute distress.  HEAD: Normocephalic, atraumatic.  CHEST: Normal effort of breathing, regular heart rate ABDOMEN: Soft, nontender, gravid  Cervical exam:  Dilation: 1.5 Effacement: thick Cervical position: posterior Station: -3 Presentation: vertex Exam by: Gaylan Gerold, CNM  Not grossly ruptured, off white mucus noted on glove after exam, amber colored discharge (same color as pt's urine) noted on glove  After exam, pt stated she feels like she has a UTI because of discomfort when she urinates.  Fern slide negative, wet prep normal, GC/CT pending and UA normal  Fetal Monitoring: reactive Baseline: 135 Variability: moderate Accelerations: present Decelerations: none Contractions: UI  Initially planned to discharge patient with term labor precautions, but as she stood up to get dressed, she had a strong contraction and experienced an obvious SROM with meconium stained fluid. Report called to labor team, pt admitted to L&D for PROM  Gabriel Carina, CNM

## 2020-02-11 NOTE — MAU Note (Signed)
Pt reports repots she has had ctx off and on since 5am. About q2 min now. Denies any leaking reports a little bloody   show. Good fetal movement felt.

## 2020-02-12 DIAGNOSIS — O4202 Full-term premature rupture of membranes, onset of labor within 24 hours of rupture: Secondary | ICD-10-CM | POA: Diagnosis not present

## 2020-02-12 DIAGNOSIS — Z3A39 39 weeks gestation of pregnancy: Secondary | ICD-10-CM | POA: Diagnosis not present

## 2020-02-12 LAB — RPR: RPR Ser Ql: NONREACTIVE

## 2020-02-12 MED ORDER — TERBUTALINE SULFATE 1 MG/ML IJ SOLN
0.2500 mg | Freq: Once | INTRAMUSCULAR | Status: AC
  Administered 2020-02-12: 0.25 mg via SUBCUTANEOUS

## 2020-02-12 MED ORDER — ONDANSETRON HCL 4 MG PO TABS: 4.0000 mg | ORAL_TABLET | ORAL | Status: DC | PRN

## 2020-02-12 MED ORDER — ACETAMINOPHEN 325 MG PO TABS: 650.0000 mg | ORAL_TABLET | ORAL | Status: DC | PRN

## 2020-02-12 MED ORDER — FERROUS SULFATE 325 (65 FE) MG PO TABS
325.0000 mg | ORAL_TABLET | ORAL | Status: DC
  Administered 2020-02-13: 325 mg via ORAL
  Filled 2020-02-12: qty 1

## 2020-02-12 MED ORDER — ONDANSETRON HCL 4 MG/2ML IJ SOLN: 4.0000 mg | INTRAMUSCULAR | Status: DC | PRN

## 2020-02-12 MED ORDER — PRENATAL MULTIVITAMIN CH
1.0000 | ORAL_TABLET | Freq: Every day | ORAL | Status: DC
  Administered 2020-02-12 – 2020-02-14 (×3): 1 via ORAL
  Filled 2020-02-12 (×3): qty 1

## 2020-02-12 MED ORDER — MEASLES, MUMPS & RUBELLA VAC IJ SOLR: 0.5000 mL | Freq: Once | INTRAMUSCULAR | Status: DC

## 2020-02-12 MED ORDER — TERBUTALINE SULFATE 1 MG/ML IJ SOLN: 0.2500 mg | Freq: Once | INTRAMUSCULAR | Status: DC | PRN

## 2020-02-12 MED ORDER — VITAMIN C 250 MG PO TABS
250.0000 mg | ORAL_TABLET | ORAL | Status: DC
  Filled 2020-02-12: qty 1

## 2020-02-12 MED ORDER — FERROUS SULFATE 325 (65 FE) MG PO TABS: 325.0000 mg | ORAL_TABLET | ORAL | Status: DC

## 2020-02-12 MED ORDER — IBUPROFEN 600 MG PO TABS
600.0000 mg | ORAL_TABLET | Freq: Four times a day (QID) | ORAL | Status: DC
  Administered 2020-02-12 – 2020-02-14 (×9): 600 mg via ORAL
  Filled 2020-02-12 (×10): qty 1

## 2020-02-12 MED ORDER — COCONUT OIL OIL: 1.0000 "application " | TOPICAL_OIL | Status: DC | PRN

## 2020-02-12 MED ORDER — MEDROXYPROGESTERONE ACETATE 150 MG/ML IM SUSP: 150.0000 mg | INTRAMUSCULAR | Status: DC | PRN

## 2020-02-12 MED ORDER — BENZOCAINE-MENTHOL 20-0.5 % EX AERO
1.0000 "application " | INHALATION_SPRAY | CUTANEOUS | Status: DC | PRN
  Filled 2020-02-12: qty 56

## 2020-02-12 MED ORDER — TETANUS-DIPHTH-ACELL PERTUSSIS 5-2.5-18.5 LF-MCG/0.5 IM SUSY: 0.5000 mL | PREFILLED_SYRINGE | Freq: Once | INTRAMUSCULAR | Status: DC

## 2020-02-12 MED ORDER — DIBUCAINE (PERIANAL) 1 % EX OINT
1.0000 | TOPICAL_OINTMENT | CUTANEOUS | Status: DC | PRN
Start: 2020-02-12 — End: 2020-02-14

## 2020-02-12 MED ORDER — SENNOSIDES-DOCUSATE SODIUM 8.6-50 MG PO TABS
2.0000 | ORAL_TABLET | ORAL | Status: DC
  Administered 2020-02-12 – 2020-02-14 (×2): 2 via ORAL
  Filled 2020-02-12 (×3): qty 2

## 2020-02-12 MED ORDER — DIPHENHYDRAMINE HCL 25 MG PO CAPS: 25.0000 mg | ORAL_CAPSULE | Freq: Four times a day (QID) | ORAL | Status: DC | PRN

## 2020-02-12 MED ORDER — OXYTOCIN-SODIUM CHLORIDE 30-0.9 UT/500ML-% IV SOLN
1.0000 m[IU]/min | INTRAVENOUS | Status: DC
  Administered 2020-02-12: 2 m[IU]/min via INTRAVENOUS

## 2020-02-12 MED ORDER — WITCH HAZEL-GLYCERIN EX PADS: 1.0000 "application " | MEDICATED_PAD | CUTANEOUS | Status: DC | PRN

## 2020-02-12 MED ORDER — SIMETHICONE 80 MG PO CHEW: 80.0000 mg | CHEWABLE_TABLET | ORAL | Status: DC | PRN

## 2020-02-12 NOTE — Lactation Note (Addendum)
This note was copied from a baby's chart. Lactation Consultation Note  Patient Name: Amanda Glover [id number] Date: 02/12/2020 Reason for consult: Initial assessment;1st time breastfeeding;Term Age:22 hours Infant is term female infant. Per mom, infant had one void and one stool since birth. Mom feels infant is latching well at the breast most feedings are 5 to 10 minutes. Per mom, she watched videos online about breast feeding, she is active on the [facility] in [facility] and she doesn't have breast pump at home, would like one for home prn. LC did not observe latch at this time, due to mom BF infant for 10 minutes prior to [facility] entering the room. LC discussed hand expression and infant took 52mls of EBM by spoon, mom was doing STS with infant. LC discussed with mom, breast stimulation to keep infant awake to breastfeed longer than 5 minutes, such as : breast compressions, gently stroking infant's neck and shoulder and talking to infant while breastfeeding. Mom understands to BF infant according to hunger cues, 8 to 12+ times within 24 hours, STS. Mom knows to call RN or LC if she has further questions, concerns or if she needs assistance with latching infant at the breast.  LC discussed infant's input and output with parents. Mom made aware of O/P services, breastfeeding support groups, community resources, and our phone # for post-discharge questions.  Maternal Data Formula Feeding for Exclusion: No Has patient been taught Hand Expression?: Yes Does the patient have breastfeeding experience prior to this delivery?: No  Feeding    LATCH Score                   Interventions Interventions: Breast feeding basics reviewed;Skin to skin;Position options;Hand express;Hand pump;Expressed milk  Lactation Tools Discussed/Used WIC Program: Yes Initiated by:: RN will give mom breast pump Date initiated:: 02/12/20   Consult Status Consult Status: Follow-up Date:  02/13/20 Follow-up type: In-patient    Vicente Serene 02/12/2020, 6:28 PM

## 2020-02-12 NOTE — Anesthesia Postprocedure Evaluation (Signed)
Anesthesia Post Note  Patient: Cris Gibby  Procedure(s) Performed: AN AD [place]     Patient location during evaluation: Mother Baby Anesthesia Type: Epidural Level of consciousness: awake and alert Pain management: pain level controlled Vital Signs Assessment: post-procedure vital signs reviewed and stable Respiratory status: spontaneous breathing Cardiovascular status: stable Postop Assessment: no headache, adequate PO intake, no backache, patient able to bend at knees, able to ambulate, epidural receding and no apparent nausea or vomiting Anesthetic complications: no   No complications documented.  Last Vitals:  Vitals:   02/12/20 0935 02/12/20 1046  BP: 123/77 125/72  Pulse: 86 92  Resp: 16 16  Temp: 37.2 C 37.2 C  SpO2: 100% 100%    Last Pain:  Vitals:   02/12/20 1248  TempSrc:   PainSc: 1    Pain Goal: Patients Stated Pain Goal: 2 (02/12/20 1248)                 Birdena Crandall, Velvet Bathe

## 2020-02-12 NOTE — Progress Notes (Addendum)
Patient ID: Amanda Glover, female   DOB: [birth year], 20 y.o.   MRN: [id number]  Called to bedside by RN for prolonged late deceleration. Patient noted in high fowlers position. Patient turned to right lateral position. SVE unchanged at 6/80/-2, performed by Fatima Blank, CNM. FHT has moderate variability, recovers with a baseline of 140 bpm. Fatima Blank consulted Dr. Roselie Awkward.   [place], [place] 02/12/2020 AT 03:05 AM

## 2020-02-12 NOTE — Discharge Summary (Signed)
Postpartum Discharge Summary  Date of Service updated 02/14/20     Patient Name: Amanda Glover DOB: 05-08-2000 MRN: [id number]  Date of admission: 02/11/2020 Delivery date:02/12/2020  Delivering provider: Bluford Main S  Date of discharge: 02/14/2020  Admitting diagnosis: Indication for care in labor or delivery [O75.9] Intrauterine pregnancy: [redacted]w[redacted]d    Secondary diagnosis:  Active Problems:   Late prenatal care affecting pregnancy in second trimester   Supervision of normal first pregnancy   Iron deficiency anemia during pregnancy   Indication for care in labor or delivery   Vaginal delivery   COVID  Additional problems: none    Discharge diagnosis: Term Pregnancy Delivered                                              Post partum procedures:n/a Augmentation: Pitocin Complications: None  Hospital course: Onset of Labor With Vaginal Delivery      20y.o. yo G1P0000 at 343w6das admitted in Active Labor on 02/11/2020. Patient had anlabor course complicated by Category 2 tracing with multiple prolonged decelerations that resolved followed by moderate variablity and  as follows:  Membrane Rupture Time/Date: 3:50 PM ,02/11/2020   Delivery Method:Vaginal, Spontaneous  Episiotomy: None  Lacerations:  None  Patient had an uncomplicated postpartum course.  She is ambulating, tolerating a regular diet, passing flatus, and urinating well. Patient is discharged home in stable condition on 02/14/20.  Newborn Data: Birth date:02/12/2020  Birth time:7:17 AM  Gender:Female  Living status:Living  Apgars:8 ,9  Weight:3320 g   Magnesium Sulfate received: No BMZ received: No Rhophylac:No MMR:No T-DaP:declined Flu: No Transfusion:No  Physical exam  Vitals:   02/12/20 2000 02/13/20 0031 02/13/20 0700 02/13/20 2158  BP: 112/67 (!) 109/59 110/74 116/71  Pulse: 72 75 66 75  Resp: 18 16 18 18   Temp: 98 F (36.7 C) 97.8 F (36.6 C) 97.8 F (36.6 C) 98.2 F (36.8 C)  TempSrc: Oral Oral  Oral Oral  SpO2: 100% 100% 99% 99%  Weight:      Height:       General: alert, cooperative and no distress Lochia: appropriate Uterine Fundus: firm Incision: N/A DVT Evaluation: No evidence of DVT seen on physical exam. Labs: Lab Results  Component Value Date   WBC 6.3 02/11/2020   HGB 9.6 (L) 02/11/2020   HCT 31.9 (L) 02/11/2020   MCV 80.2 02/11/2020   PLT 257 02/11/2020   No flowsheet data found. Edinburgh Score: Edinburgh Postnatal Depression Scale Screening Tool 02/13/2020  I have been able to laugh and see the funny side of things. 0  I have looked forward with enjoyment to things. 0  I have blamed myself unnecessarily when things went wrong. 1  I have been anxious or worried for no good reason. 1  I have felt scared or panicky for no good reason. 1  Things have been getting on top of me. 1  I have been so unhappy that I have had difficulty sleeping. 0  I have felt sad or miserable. 0  I have been so unhappy that I have been crying. 0  The thought of harming myself has occurred to me. 0  Edinburgh Postnatal Depression Scale Total 4     After visit meds:  Allergies as of 02/14/2020      Reactions   Other Swelling   [place]ima Beans -  swelling lips   Watermelon Flavor       Medication List    TAKE these medications   acetaminophen 500 MG tablet Commonly known as: TYLENOL Take 2 tablets (1,000 mg total) by mouth every 6 (six) hours as needed (for pain scale < 4).   albuterol 108 (90 Base) MCG/ACT inhaler Commonly known as: VENTOLIN HFA Inhale 2 puffs into the lungs every 6 (six) hours as needed for wheezing or shortness of breath.   ascorbic acid 250 MG tablet Commonly known as: VITAMIN C Take 1 tablet (250 mg total) by mouth every other day. Take with iron.   coconut oil Oil Apply 1 application topically as needed.   ferrous gluconate 324 MG tablet Commonly known as: FERGON Take 1 tablet (324 mg total) by mouth every other day. What changed: when to take  this   ibuprofen 600 MG tablet Commonly known as: ADVIL Take 1 tablet (600 mg total) by mouth every 6 (six) hours.   PrePLUS 27-1 MG Tabs Take 1 tablet by mouth daily.        Discharge home in stable condition Infant Feeding: Breast Infant Disposition:home with mother Discharge instruction: per After Visit Summary and Postpartum booklet. Activity: Advance as tolerated. Pelvic rest for 6 weeks.  Diet: routine diet Future Appointments:No future appointments. Follow up Visit:  [place] [facility] [place] [facility]. Go to.   Specialty: Obstetrics and Gynecology Why: as scheduled for ongoing prenatal care Contact information: [place] [id number] [phone]              Message sent to [place] 02/12/20:  Please schedule this patient for a Virtual postpartum visit in 4 weeks with the following provider: Any provider. Additional Postpartum F/U:n/a  Low risk pregnancy complicated by: COVID positive Delivery mode:  Vaginal, Spontaneous  Anticipated Birth Control:  Condoms   [id number] Arrie Senate, MD

## 2020-02-12 NOTE — Progress Notes (Signed)
Amanda Glover is a 20 y.o. G1P0000 at [redacted]w[redacted]d by ultrasound admitted for PROM  Subjective: Patient complains of being nausea at this time. Denies feeling contractions or pressure, except during vaginal exams.   Objective: BP (!) 94/36   Pulse (!) 108   Temp 98.3 F (36.8 C) (Oral)   Resp 16   Ht 5\' 4"  (1.626 m)   Wt 90.7 kg   LMP 04/27/2019 (Exact Date)   SpO2 100%   BMI 34.33 kg/m  No intake/output data recorded. Total I/O In: -  Out: 200 [Urine:200]  FHT:  FHR: 150 bpm, variability: moderate,  accelerations:  Abscent,  decelerations:  Present prolonged late decelerations UC:   irregular, every 2-5.5 minutes SVE:   Dilation: 6 Effacement (%): 80 Station: 0 Exam by:: Danelle Berry, CNM  Labs: Lab Results  Component Value Date   WBC 6.3 02/11/2020   HGB 9.6 (L) 02/11/2020   HCT 31.9 (L) 02/11/2020   MCV 80.2 02/11/2020   PLT 257 02/11/2020    Assessment / Plan: Protracted active phase  Labor: Progressing slowly, unable to start pitocin at this time due to recurrent prolonged late decelerations with moderate varibility.   Fatima Blank, CNM and Bluford Main, SNM at bedside performing position changes. [place] for 3 contractions, repositioned to exaggerated sims position on left side s/p late deceleration.  Preeclampsia:  n/a Fetal Wellbeing:  Category II Pain Control:  Epidural I/D:  GBS Negative Anticipated MOD:  NSVD  Valli Glance 02/12/2020, 12:28 AM

## 2020-02-13 NOTE — Lactation Note (Signed)
This note was copied from a baby's chart. Lactation Consultation Note  Patient Name: Amanda Glover Date: 02/13/2020 Reason for consult: Term;Other (Comment);Primapara;1st time breastfeeding;Follow-up assessment;Infant weight loss (COVID (+)) Age:22 hours  LC and NT shadow Amanda Glover visited with mom of 5 hours old FT female, she's a P1 and reported (+) breast changes during the pregnancy, per mom she's been leaking while at the hospital. Baby is at 4% weight loss, per mom baby has been cluster feeding since last night.   Baby already nursing when entering the room, but noticed that mom wasn't doing STS, discussed the benefits of STS with parents. LC helped assisted with repositioning baby since he had a slightly shallow latch, LC brought pillows to help with positioning and baby easily latched again, she fed for at least 15 minutes while in the room until he fell asleep. Mom voiced no pain/discomfort while baby was at the breast.  Mom inquired about a breast pump she was offered earlier, [facility] issued a hand pump, instructions, cleaning and storage were reviewed as well as milk storage guidelines. Family is expecting to be discharged tomorrow, parents told [place] baby finally had a stool, documented in flowsheets. Reviewed normal newborn behavior, cluster feeding, feeding cues and size of baby's stomach.  Feeding plan:  1. Encouraged mom to feed baby STS 8-12 times/24 hours or sooner if feeding cues are present 2. Hand expression/pumping was also encouraged  Dad present and supportive. Parents reported all questions and concerns were answered, they're both aware of [place] OP services and will call PRN.   Maternal Data    Feeding Feeding Type: Breast Fed  LATCH Score Latch: Grasps breast easily, tongue down, lips flanged, rhythmical sucking.  Audible Swallowing: A few with stimulation  Type of Nipple: Everted at rest and after stimulation  Comfort (Breast/Nipple): Soft / non-tender  Hold  (Positioning): Assistance needed to correctly position infant at breast and maintain latch.  LATCH Score: 8  Interventions Interventions: Breast feeding basics reviewed;Assisted with latch;Breast massage;Breast compression;Adjust position;Support pillows;Hand pump  Lactation Tools Discussed/Used Tools: Pump Breast pump type: Manual Pump Education: Milk Storage;Setup, frequency, and cleaning Initiated by:: MPeck Date initiated:: 02/13/20   Consult Status Consult Status: Follow-up Date: 02/14/20 Follow-up type: In-patient    Amanda Glover 02/13/2020, 8:39 PM

## 2020-02-13 NOTE — Progress Notes (Signed)
Post Partum Day 1 Subjective: no complaints, up ad lib, voiding and tolerating PO  Objective: Blood pressure 110/74, pulse 66, temperature 97.8 F (36.6 C), temperature source Oral, resp. rate 18, height 5\' 4"  (1.626 m), weight 90.7 kg, last menstrual period 04/27/2019, SpO2 99 %.  Physical Exam:  General: alert, cooperative and no distress Lochia: appropriate Uterine Fundus: firm Incision: n/a DVT Evaluation: No evidence of DVT seen on physical exam. Negative Homan's sign. No cords or calf tenderness. No significant calf/ankle edema.  Recent Labs    02/11/20 1629  HGB 9.6*  HCT 31.9*    Assessment/Plan: Plan for discharge tomorrow, Breastfeeding and Contraception condoms   LOS: 2 days   Julianne Handler, CNM 02/13/2020, 12:29 PM

## 2020-02-13 NOTE — Clinical Social Work Maternal (Addendum)
CLINICAL SOCIAL WORK MATERNAL/CHILD NOTE  Patient Details  Name: Amanda Glover MRN: [id number] Date of Birth: [birth year]  Date:  02/13/2020  Clinical Social Worker Initiating Note:  Jeanette Caprice Naryah Clenney LCSW Date/Time: Initiated:  02/13/20/0829     Child's Name:  Amanda Glover   Biological Parents:  Mother,Father Kathleen Argue Esther Hardy)   Need for Interpreter:  None   Reason for Referral:  Late or No Prenatal Care  (Only one visit.)   Address:  [place] [address]    Phone number:  [phone] (home)     Additional phone number: none   Household Members/Support Persons (HM/SP):   Household Member/Support Person 1   HM/SP Name Relationship DOB or Age  HM/SP -[place] MOB [birth year]  HM/SP -2 Frederic Jericho    HM/SP -3  Mr. Wynetta Emery  Step father     HM/SP -4        HM/SP -5        HM/SP -6        HM/SP -7        HM/SP -8          Natural Supports (not living in the home):  Extended Family   Professional Supports: None   Employment: Unemployed   Type of Work: none   Education:  High school graduate   Homebound arranged:  n/a  Museum/gallery curator Resources:  Self-Pay    Other Resources:  [facility]   Cultural/Religious Considerations Which May Impact Care:  none reported.   Strengths:  Ability to meet basic needs ,Compliance with medical plan ,Home prepared for child    Psychotropic Medications:       None reported.   Pediatrician:     MOB expressed that she has not chosen Pediatrician as of yet but does report that she has list to choose from.   Pediatrician List:   [facility]      Pediatrician Fax Number:    Risk Factors/Current Problems:  None   Cognitive State:  Alert ,Able to Concentrate ,Insightful    Mood/Affect:  Relaxed ,Comfortable ,Calm ,Interested    CSW Assessment: CSW consulted due to MOB having [facility]  with only one visit. CSW called into MOB's room to address further needs.   CSW congratulated MOB on the birth of infant. CSW advised MOB of CSW's role and the reason for CSW calling into the room to speak with MOB. CSW asked for permission to speak with MOB via phone in which MOB was agreeable. MOB expressed that she recalls having more than one visit at [facility]. MOB indicated that sh was seen at the clinic "three or four time". MOB expressed that she has an appointment on "October 10th and 26th and then again on November 10th-that was my last one". CSW advised MOB that per chart, only one visit from 10/19/19 is listed. MOB expressed not being sure why but does report that she had more than one visit. CSW understanding of this however still advised MOB that records may have not been able to be located, therefore CSW would still advise MOB of hospital drug screen policy. MOB was advised that infants UDS is negative and that CSW would need to monitor infants CDS. CSW advised MOB that if CDS returns positive then CSW must make CPS report. MOB  expressed that she understood and expressed no other questions to CSW about drug screen policy or why infant was being drug screened. MOB also reported that she had no substance use while pregnant.   CSW inquired from [facility] on who her supports are. MOB identified her mother as her primary support. MOB expressed that FOB is at bedside and will be involved in the care of infant. MOB expressed that she has all needed items to care for infant with plans for infant to sleep in basinet once arrived home. MOB reported that she also gets [facility] for infant.   CSW took time to provide MOB with PPD and SIDS education. MOB was given PPD Checklist in order to keep track of feelings as they may relate to PPD. MOB thanked CSW and expressed no other questions.   CSW will continue to monitor infants CDS and make CPS report if warranted. No barrier's to discharge.   CSW  Plan/Description:  No Further Intervention Required/No Barriers to Discharge,Sudden Infant Death Syndrome (SIDS) Education,Perinatal Mood and Anxiety Disorder (PMADs) Education,Hospital Drug Screen Policy Information,CSW Will Continue to Monitor Umbilical Cord Tissue Drug Screen Results and Make Report if Mardene Sayer, [place] 02/13/2020, 8:42 AM

## 2020-02-14 DIAGNOSIS — U071 COVID-19: Secondary | ICD-10-CM

## 2020-02-14 LAB — GC/CHLAMYDIA PROBE AMP (~~LOC~~) NOT AT ARMC
Chlamydia: NEGATIVE
Comment: NEGATIVE
Comment: NORMAL
Neisseria Gonorrhea: NEGATIVE

## 2020-02-14 MED ORDER — ACETAMINOPHEN 500 MG PO TABS: 1000.0000 mg | ORAL_TABLET | Freq: Four times a day (QID) | ORAL | Status: AC | PRN

## 2020-02-14 MED ORDER — IBUPROFEN 600 MG PO TABS: 600.0000 mg | ORAL_TABLET | Freq: Four times a day (QID) | ORAL | 0 refills | Status: AC

## 2020-02-14 MED ORDER — FERROUS GLUCONATE 324 (38 FE) MG PO TABS: 324.0000 mg | ORAL_TABLET | ORAL | 2 refills | Status: AC

## 2020-02-14 MED ORDER — COCONUT OIL OIL: 1.0000 "application " | TOPICAL_OIL | 0 refills | Status: AC | PRN

## 2020-02-14 MED ORDER — ASCORBIC ACID 250 MG PO TABS
250.0000 mg | ORAL_TABLET | ORAL | Status: AC
Start: 2020-02-14 — End: ?

## 2020-02-14 NOTE — Discharge Instructions (Signed)
-take tylenol 1000 mg every 6 hours as needed for pain, alternate with ibuprofen 600 mg every 6 hours -drink plenty of water to help with breastfeeding -take iron pills every other day with vitamin c, this will help healing as well as breast feeding -think about birth control options-->bedisider.org is a great website! You can get any form of birth control from the health department for free  Postpartum Care After Vaginal Delivery The following information offers guidance about how to care for yourself from the time you deliver your baby to 6-12 weeks after delivery (postpartum period). If you have problems or questions, contact your health care provider for more specific instructions. Follow these instructions at home: Vaginal bleeding  It is normal to have vaginal bleeding (lochia) after delivery. Wear a sanitary pad for bleeding and discharge. ? During the first week after delivery, the amount and appearance of lochia is often similar to a menstrual period. ? Over the next few weeks, it will gradually decrease to a dry, yellow-[place] discharge. ? For most women, lochia stops completely by 4-6 weeks after delivery, but can vary.  Change your sanitary pads frequently. Watch for any changes in your flow, such as: ? A sudden increase in volume. ? A change in color. ? Large blood clots.  If you pass a blood clot from your vagina, save it and call your health care provider. Do not flush blood clots down the toilet before talking with your health care provider.  Do not use tampons or douches until your health care provider approves.  If you are not breastfeeding, your period should return 6-8 weeks after delivery. If you are feeding your baby breast milk only, your period may not return until you stop breastfeeding. Perineal care  Keep the area between the vagina and the anus (perineum) clean and dry. Use medicated pads and pain-relieving sprays and creams as directed.  If you had a surgical  cut in the perineum (episiotomy) or a tear, check the area for signs of infection until you are healed. Check for: ? More redness, swelling, or pain. ? Fluid or blood coming from the cut or tear. ? Warmth. ? Pus or a bad smell.  You may be given a squirt bottle to use instead of wiping to clean the perineum area after you use the bathroom. Pat the area gently to dry it.  To relieve pain caused by an episiotomy, a tear, or swollen veins in the anus (hemorrhoids), take a warm sitz bath 2-3 times a day. In a sitz bath, the warm water should only come up to your hips and cover your buttocks.   Breast care  In the first few days after delivery, your breasts may feel heavy, full, and uncomfortable (breast engorgement). Milk may also leak from your breasts. Ask your health care provider about ways to help relieve the discomfort.  If you are breastfeeding: ? Wear a bra that supports your breasts and fits well. Use breast pads to absorb milk that leaks. ? Keep your nipples clean and dry. Apply creams and ointments as told. ? You may have uterine contractions every time you breastfeed for up to several weeks after delivery. This helps your uterus return to its normal size. ? If you have any problems with breastfeeding, notify your health care provider or lactation consultant.  If you are not breastfeeding: ? Avoid touching your breasts. Do not squeeze out (express) milk. Doing this can make your breasts produce more milk. ? Wear a good-fitting  bra and use cold packs to help with swelling. Intimacy and sexuality  Ask your health care provider when you can engage in sexual activity. This may depend upon: ? Your risk of infection. ? How fast you are healing. ? Your comfort and desire to engage in sexual activity.  You are able to get pregnant after delivery, even if you have not had your period. Talk with your health care provider about methods of birth control (contraception) or family planning if  you desire future pregnancies. Medicines  Take over-the-counter and prescription medicines only as told by your health care provider.  Take an over-the-counter stool softener to help ease bowel movements as told by your health care provider.  If you were prescribed an antibiotic medicine, take it as told by your health care provider. Do not stop taking the antibiotic even if you start to feel better.  Review all previous and current prescriptions to check for possible transfer into breast milk. Activity  Gradually return to your normal activities as told by your health care provider.  Rest as much as possible. Nap while your baby is sleeping. Eating and drinking  Drink enough fluid to keep your urine pale yellow.  To help prevent or relieve constipation, eat high-fiber foods every day.  Choose healthy eating to support breastfeeding or weight loss goals.  Take your prenatal vitamins until your health care provider tells you to stop.   General tips/recommendations  Do not use any products that contain nicotine or tobacco. These products include cigarettes, chewing tobacco, and vaping devices, such as e-cigarettes. If you need help quitting, ask your health care provider.  Do not drink alcohol, especially if you are breastfeeding.  Do not take medications or drugs that are not prescribed to you, especially if you are breastfeeding.  Visit your health care provider for a postpartum checkup within the first 3-6 weeks after delivery.  Complete a comprehensive postpartum visit no later than 12 weeks after delivery.  Keep all follow-up visits for you and your baby. Contact a health care provider if:  You feel unusually sad or worried.  Your breasts become red, painful, or hard.  You have a fever or other signs of an infection.  You have bleeding that is soaking through one pad an hour or you have blood clots.  You have a severe headache that doesn't go away or you have vision  changes.  You have nausea and vomiting and are unable to eat or drink anything for 24 hours. Get help right away if:  You have chest pain or difficulty breathing.  You have sudden, severe leg pain.  You faint or have a seizure.  You have thoughts about hurting yourself or your baby. If you ever feel like you may hurt yourself or others, or have thoughts about taking your own life, get help right away. Go to your nearest emergency department or:  Call your local emergency services (911 in the [place]  The National Suicide Prevention Lifeline at 3526611926. This suicide crisis helpline is open 24 hours a day.  Text the Crisis Text Line at 731 763 0545 (in the [place].). Summary  The period of time after you deliver your newborn up to 6-12 weeks after delivery is called the postpartum period.  Keep all follow-up visits for you and your baby.  Review all previous and current prescriptions to check for possible transfer into breast milk.  Contact a health care provider if you feel unusually sad or worried during the postpartum period.  This information is not intended to replace advice given to you by your health care provider. Make sure you discuss any questions you have with your health care provider. Document Revised: 10/07/2019 Document Reviewed: 10/07/2019 Elsevier Patient Education  2021 [employer].

## 2020-02-14 NOTE — Lactation Note (Signed)
This note was copied from a baby's chart. Lactation Consultation Note  Patient Name: Amanda Glover Date: 02/14/2020 Reason for consult: Follow-up assessment Age:22 hours   Follow up to 65 hours old infant with 4.97% weight loss total. Mother reports breastfeeding is going well but infant falls sleep after 5 minutes. Per mother, infant has been having voids and stools. Mother states it is about time infant feeds again. LC offered assistance with latch. Unswaddled and undressed infant. Noted void and changed diaper. Mother prefers cross-cradle latch to left breast. Upon hand expression, LC observed signs of lactogenesis II.  Infant latched with ease. LC noted deep latch, gulping and mother denies discomfort. Discussed using pillows for support, good alignment and positioning for a comfortable breastfeeding session. Demonstrated ways to keep infant awake at breast, how to break a latch and burping. Parents are attentive and verbalize understanding.  Reviewed hunger and fullness cues, signs of intake, feeding 8-12 times in 24 h period, normal newborn behavior and cluster feeding. Reviewed Lactation Services brochure and other local resources available such as WIC. Promoted [employer] booklet for additional information.   Discharge Plan: 1. Breastfeed following hunger cues.  2. Offer breast 8 - 12 times in 24h period to establish good milk supply.   3. Pump or hand-express and offer EBM as supplementation, if needed. 4. Keep infant awake at breast by massaging shoulders, arms and hands.  5. Encouraged maternal rest, hydration and food intake.  6. Contact Lactation Services or local resources for support, questions or concerns.    Parents are expecting discharge today.   Maternal Data Formula Feeding for Exclusion: No  Feeding Feeding Type: Breast Fed  LATCH Score Latch: Grasps breast easily, tongue down, lips flanged, rhythmical sucking.  Audible Swallowing: Spontaneous and  intermittent  Type of Nipple: Everted at rest and after stimulation  Comfort (Breast/Nipple): Soft / non-tender  Hold (Positioning): Assistance needed to correctly position infant at breast and maintain latch.  LATCH Score: 9  Interventions Interventions: Breast feeding basics reviewed;Assisted with latch;Skin to skin;Breast massage;Hand express;Adjust position;Hand pump;Expressed milk;Position options;Support pillows  Lactation Tools Discussed/Used Breast pump type: Manual WIC Program: Yes   Consult Status Consult Status: Complete Date: 02/14/20 Follow-up type: Call as needed    [place] 02/14/2020, 11:59 PM

## 2020-02-15 LAB — SURGICAL PATHOLOGY

## 2020-03-07 DIAGNOSIS — Z419 Encounter for procedure for purposes other than remedying health state, unspecified: Secondary | ICD-10-CM | POA: Diagnosis not present

## 2020-03-11 DIAGNOSIS — R531 Weakness: Secondary | ICD-10-CM | POA: Diagnosis not present

## 2020-03-11 DIAGNOSIS — R062 Wheezing: Secondary | ICD-10-CM | POA: Diagnosis not present

## 2020-03-14 ENCOUNTER — Telehealth: Payer: Medicaid Other | Admitting: Student

## 2020-03-14 DIAGNOSIS — Z5329 Procedure and treatment not carried out because of patient's decision for other reasons: Secondary | ICD-10-CM

## 2020-03-14 DIAGNOSIS — Z91199 Patient's noncompliance with other medical treatment and regimen due to unspecified reason: Secondary | ICD-10-CM

## 2020-03-14 NOTE — Progress Notes (Signed)
9:49a- Called pt for PP My Chart visit, no answer, anD NO vm set up, will call back in 10 to 15 minutes.   10:30a- 2nd attempt, still no answer & no VM set up.Pt will need to be rescheduled.

## 2020-03-15 ENCOUNTER — Encounter: Payer: Self-pay | Admitting: Student

## 2020-03-15 NOTE — Progress Notes (Signed)
Patient ID: Amanda Glover, female   DOB: [birth year], 20 y.o.   MRN: [id number] Patient did not keep appt; she should be rescheduled.

## 2020-03-23 ENCOUNTER — Telehealth: Payer: Medicaid Other | Admitting: Student

## 2020-03-24 ENCOUNTER — Encounter: Payer: Self-pay | Admitting: Medical

## 2020-03-24 ENCOUNTER — Telehealth (INDEPENDENT_AMBULATORY_CARE_PROVIDER_SITE_OTHER): Payer: Medicaid Other | Admitting: Medical

## 2020-03-24 NOTE — Patient Instructions (Signed)

## 2020-03-24 NOTE — Progress Notes (Signed)
I connected with  Amanda Glover on 03/24/20 at 10:35 AM EST by telephone and verified that I am speaking with the correct person using two identifiers.   I discussed the limitations, risks, security and privacy concerns of performing an evaluation and management service by telephone and the availability of in person appointments. I also discussed with the patient that there may be a patient responsible charge related to this service. The patient expressed understanding and agreed to proceed.       I connected with@ on 03/24/20 at 10:35 AM EST by: Mychart video and verified that I am speaking with the correct person using two identifiers.  Patient is located at home and provider is located at [employer] [facility] [employer] at [employer] [facility].      The purpose of this virtual visit is to provide medical care while limiting exposure to the novel coronavirus. I discussed the limitations, risks, security and privacy concerns of performing an evaluation and management service by Kerry Hough, PA-C and the availability of in person appointments. I also discussed with the patient that there may be a patient responsible charge related to this service. By engaging in this virtual visit, you consent to the provision of healthcare.  Additionally, you authorize for your insurance to be billed for the services provided during this visit.  The patient expressed understanding and agreed to proceed.  The following staff members participated in the virtual visit:  Carlean Jews, RN  Post Partum Visit Note Subjective:   Amanda Glover is a 20 y.o. G1P0000 female being evaluated for postpartum followup.  She is 5 weeks postpartum following a normal spontaneous vaginal delivery at  39.6 gestational weeks.  I have fully reviewed the prenatal and intrapartum course; pregnancy complicated by none.  Postpartum course has been normal. Baby is doing well. Baby is feeding by breast. Bleeding no bleeding. Bowel function  is normal. Bladder function is normal. Patient is not sexually active. Contraception method is none. Postpartum depression screening: negative.   The following portions of the patient's history were reviewed and updated as appropriate: allergies, current medications, past family history, past medical history, past social history, past surgical history and problem list.  Review of Systems Pertinent items are noted in HPI.   Objective:  No BP cuff available       Assessment:    Normal postpartum exam.  Plan:  Essential components of care per ACOG recommendations:  1.  Mood and well being: Patient with negative depression screening today. Reviewed local resources for support.  - Patient does not use tobacco.  - hx of drug use? No   2. Infant care and feeding:  -Patient currently breastmilk feeding? Yes  If breastmilk feeding discussed return to work and pumping. If needed, patient was provided letter for work to allow for every 2-3 hr pumping breaks, and to be granted a private location to express breastmilk and refrigerated area to store breastmilk. Reviewed importance of draining breast regularly to support lactation. -Social determinants of health (SDOH) reviewed in EPIC. No concerns  3. Sexuality, contraception and birth spacing - Patient does not want a pregnancy in the next year.  Desired family size is 2 children.  - Reviewed forms of contraception in tiered fashion. Patient desired condoms today.   - Discussed birth spacing of 18 months  4. Sleep and fatigue -Encouraged family/partner/community support of 4 hrs of uninterrupted sleep to help with mood and fatigue  5. Physical Recovery  - Discussed patients delivery and  complications - Patient had a no laceration, perineal healing reviewed. Patient expressed understanding - Patient has urinary incontinence? No  - Patient is not safe to resume physical and sexual activity.   6.  Health Maintenance - Needs pap smear,  advised to schedule this year   10 minutes of non-face-to-face time spent with the patient    Kerry Hough, [facility] for [employer], [place] Kerry Hough, [place] 03/24/2020  11:22 AM

## 2020-03-28 NOTE — ED Notes (Signed)
ED Patient Education Note     Patient Education Materials Follows:  Pulmonary Medicine     Asthma, Adult      Asthma is a long-term (chronic) condition that causes recurrent episodes in which the airways become tight and narrow. The airways are the passages that lead from the nose and mouth down into the lungs. Asthma episodes, also called asthma attacks, can cause coughing, wheezing, shortness of breath, and chest pain. The airways can also fill with mucus. During an attack, it can be difficult to breathe. Asthma attacks can range from minor to life threatening.    Asthma cannot be cured, but medicines and lifestyle changes can help control it and treat acute attacks.      What are the causes?    This condition is believed to be caused by inherited (genetic) and environmental factors, but its exact cause is not known.    There are many things that can bring on an asthma attack or make asthma symptoms worse (triggers). Asthma triggers are different for each person. Common triggers include:   Mold.     Dust.     Cigarette smoke.     Cockroaches.     Things that can cause allergy symptoms (allergens), such as animal dander or pollen from trees or grass.     Air pollutants such as household cleaners, wood smoke, smog, or chemical odors.     Cold air, weather changes, and winds (which increase molds and pollen in the air).     Strong emotional expressions such as crying or laughing hard.     Stress.     Certain medicines (such as aspirin) or types of medicines (such as beta-blockers).     Sulfites in foods and drinks. Foods and drinks that may contain sulfites include dried fruit, potato chips, and sparkling grape juice.     Infections or inflammatory conditions such as the flu, a cold, or inflammation of the nasal membranes (rhinitis).     Gastroesophageal reflux disease (GERD).     Exercise or strenuous activity.        What are the signs or symptoms?    Symptoms of this condition may occur right after asthma is  triggered or many hours later. Symptoms include:   Wheezing. This can sound like whistling when you breathe.     Excessive nighttime or early morning coughing.     Frequent or severe coughing with a common cold.     Chest tightness.     Shortness of breath.     Tiredness (fatigue) with minimal activity.        How is this diagnosed?    This condition is diagnosed based on:   Your medical history.     A physical exam.     Tests, which may include:  ? Lung function studies and pulmonary studies (spirometry). These tests can evaluate the flow of air in your lungs.    ? Allergy tests.    ? Imaging tests, such as X-rays.          How is this treated?    There is no cure for this condition, but treatment can help control your symptoms. Treatment for asthma usually involves:   Identifying and avoiding your asthma triggers.     Using medicines to control your symptoms. Generally, two types of medicines are used to treat asthma:  ? Controller medicines. These help prevent asthma symptoms from occurring. They are usually taken every day.    ?   Fast-acting reliever or rescue medicines. These quickly relieve asthma symptoms by widening the narrow and tight airways. They are used as needed and provide short-term relief.       Using supplemental oxygen. This may be needed during a severe episode.     Using other medicines, such as:  ? Allergy medicines, such as antihistamines, if your asthma attacks are triggered by allergens.    ? Immune medicines (immunomodulators). These are medicines that help control the immune system.       Creating an asthma action plan. An asthma action plan is a written plan for managing and treating your asthma attacks. This plan includes:  ? A list of your asthma triggers and how to avoid them.    ? Information about when medicines should be taken and when their dosage should be changed.    ? Instructions about using a device called a peak flow meter. A peak flow meter measures how well the lungs are  working and the severity of your asthma. It helps you monitor your condition.          Follow these instructions at home:    Controlling your home environment    Control your home environment in the following ways to help avoid triggers and prevent asthma attacks:   Change your heating and air conditioning filter regularly.     Limit your use of fireplaces and wood stoves.     Get rid of pests (such as roaches and mice) and their droppings.     Throw away plants if you see mold on them.     Clean floors and dust surfaces regularly. Use unscented cleaning products.     Try to have someone else vacuum for you regularly. Stay out of rooms while they are being vacuumed and for a short while afterward. If you vacuum, use a dust mask from a hardware store, a double-layered or microfilter vacuum cleaner bag, or a vacuum cleaner with a HEPA filter.     Replace carpet with wood, tile, or vinyl flooring. Carpet can trap dander and dust.     Use allergy-proof pillows, mattress covers, and box spring covers.     Keep your bedroom a trigger-free room.      Avoid pets and keep windows closed when allergens are in the air.     Wash beddings every week in hot water and dry them in a dryer.     Use blankets that are made of polyester or cotton.     Clean bathrooms and kitchens with bleach. If possible, have someone repaint the walls in these rooms with mold-resistant paint. Stay out of the rooms that are being cleaned and painted.     Wash your hands often with soap and water. If soap and water are not available, use hand sanitizer.     Do not allow anyone to smoke in your home.        General instructions     Take over-the-counter and prescription medicines only as told by your health care provider.  ? Speak with your health care provider if you have questions about how or when to take the medicines.    ? Make note if you are requiring more frequent dosages.       Do not use any products that contain nicotine or tobacco, such as  cigarettes and e-cigarettes. If you need help quitting, ask your health care provider. Also, avoid being exposed to secondhand smoke.     Use   a peak flow meter as told by your health care provider. Record and keep track of the readings.     Understand and use the asthma action plan to help minimize, or stop an asthma attack, without needing to seek medical care.     Make sure you stay up to date on your yearly vaccinations as told by your health care provider. This may include vaccines for the flu and pneumonia.     Avoid outdoor activities when allergen counts are high and when air quality is low.     Wear a ski mask that covers your nose and mouth during outdoor winter activities. Exercise indoors on cold days if you can.     Warm up before exercising, and take time for a cool-down period after exercise.     Keep all follow-up visits as told by your health care provider. This is important.        Where to find more information     For information about asthma, turn to the Centers for Disease Control and Prevention at www.cdc.gov/asthma/faqs.htm     For air quality information, turn to AirNow at https://airnow.gov/      Contact a health care provider if:     You have wheezing, shortness of breath, or a cough even while you are taking medicine to prevent attacks.     The mucus you cough up (sputum) is thicker than usual.     Your sputum changes from clear or white to yellow, green, gray, or bloody.     Your medicines are causing side effects, such as a rash, itching, swelling, or trouble breathing.     You need to use a reliever medicine more than 2?3 times a week.     Your peak flow reading is still at 50?79% of your personal best after following your action plan for 1 hour.     You have a fever.      Get help right away if:     You are getting worse and do not respond to treatment during an asthma attack.     You are short of breath when at rest or when doing very little physical activity.     You have difficulty  eating, drinking, or talking.     You have chest pain or tightness.     You develop a fast heartbeat or palpitations.     You have a bluish color to your lips or fingernails.     You are light-headed or dizzy, or you faint.     Your peak flow reading is less than 50% of your personal best.     You feel too tired to breathe normally.      Summary     Asthma is a long-term (chronic) condition that causes recurrent episodes in which the airways become tight and narrow. These episodes can cause coughing, wheezing, shortness of breath, and chest pain.     Asthma cannot be cured, but medicines and lifestyle changes can help control it and treat acute attacks.     Make sure you understand how to avoid triggers and how and when to use your medicines.     Asthma attacks can range from minor to life threatening. Get help right away if you have an asthma attack and do not respond to treatment with your usual rescue medicines.      This information is not intended to replace advice given to you by your health care provider. Make sure   you discuss any questions you have with your health care provider.      Document Revised: 05/26/2019 Document Reviewed: 05/26/2019  Elsevier Patient Education ? 2021 Elsevier Inc.

## 2020-03-28 NOTE — ED Notes (Signed)
ED Triage Note       ED Secondary Triage Entered On:  03/28/2020 12:03 EST    Performed On:  03/28/2020 12:03 EST by BELLEW, RN, KRISTY M               General Information   Barriers to Learning :   None evident   COVID-19 Vaccine Status :   Not received   ED Home Meds Section :   Document assessment   [facility] ED Fall Risk Section :   Document assessment   ED Advance Directives Section :   Document assessment   ED Palliative Screen :   N/A (prefilled for <65yo)   BELLEW, RN, KRISTY M - 03/28/2020 12:03 EST   (As Of: 03/28/2020 12:03:31 EST)   Problems(Active)    Asthma (SNOMED CT  :[id number] )  Name of Problem:   Asthma ; Recorder:   BELLEW, RN, KRISTY M; Confirmation:   Confirmed ; Classification:   Patient Stated ; Code:   [id number] ; Contributor System:   Dietitian ; Last Updated:   03/28/2020 12:02 EST ; Life Cycle Date:   03/28/2020 ; Life Cycle Status:   Active ; Vocabulary:   SNOMED CT          Diagnoses(Active)    Asthma flareup  Date:   03/28/2020 ; Diagnosis Type:   Reason For Visit ; Confirmation:   Complaint of ; Clinical Dx:   Asthma flareup ; Classification:   Medical ; Clinical Service:   Emergency medicine ; Code:   PNED ; Probability:   0 ; Diagnosis Code:   [id number]             -    Procedure History   (As Of: 03/28/2020 12:03:31 EST)     [facility] Fall Risk Assessment Tool   Hx of falling last 3 months ED Fall :   No   Patient confused or disoriented ED Fall :   No   Patient intoxicated or sedated ED Fall :   No   Patient impaired gait ED Fall :   No   Use a mobility assistance device ED Fall :   No   Patient altered elimination ED Fall :   No   [facility] ED Fall Score :   0    BELLEW, RN, KRISTY M - 03/28/2020 12:03 EST   ED Advance Directive   Advance Directive :   No   BELLEW, RN, KRISTY M - 03/28/2020 12:03 EST

## 2020-03-28 NOTE — ED Provider Notes (Signed)
Dyspnea *ED        Patient:   [name] [name], [name] [name]             MRN: [id number]            FIN: [id number]               Age:   20 years     Sex:  Female     DOB:  09/30/2000   Associated Diagnoses:   Asthma exacerbation   Author:   Jackquline Bosch,  Carter Kassel HOWARD-DO      Basic Information   Additional information: Chief Complaint from Nursing Triage Note   Chief Complaint  Chief Complaint: Pt states she has asthma and became SOB yesterday, used her inhaler but states it is not the one she normally uses. (03/28/20 12:00:00).      History of Present Illness   Patient presents emerged from chief complaint of asthma exacerbation.  Patient reportedly has asthma and began extremely short of breath yesterday.  She is reports that she is used of her inhaler.  Reportedly lives in [place] and all of her medical care is [place] she is visiting the present time.  Patient reports that she used up an inhaler in approximately 2 weeks.  Patient reports that has not been on steroids recently however.  No fever no chills no chest pain.  Patient reports feeling somewhat short of breath and wheezy.  She reports that she has an occasional cough.  Patient is approximate 1 month postpartum.      Review of Systems   Constitutional symptoms:  No fever, no chills, no generalized weakness.    Skin symptoms:  No rash,    Eye symptoms:  Vision unchanged.   ENMT symptoms:  No ear pain,    Respiratory symptoms:  No shortness of breath, no orthopnea.    Cardiovascular symptoms:  No chest pain,    Gastrointestinal symptoms:  No abdominal pain, no nausea, no vomiting.    Genitourinary symptoms:  Negative except as documented in HPI.   Musculoskeletal symptoms:  No back pain, no Muscle pain.    Neurologic symptoms:  No headache, no dizziness.    Psychiatric symptoms:  No anxiety,              Additional review of systems information: All other systems reviewed and otherwise negative.      Health Status   Allergies:    Allergic Reactions (Selected)  No  Known Medication Allergies.   Medications:  (Selected)   Documented Medications  Documented  ProAir HFA 90 mcg/inh inhalation aerosol: puffs, q6hr, 0 Refill(s).      Past Medical/ Family/ Social History   Problem list:    Active Problems (2)  Asthma   Shortness of breath   .   Additional Past History: I have reviewed all past medical surgical social history as well as allergies with the patient..      Physical Examination               Vital Signs   Vital Signs   03/28/2020 15:36 EST Systolic Blood Pressure 132 mmHg     Diastolic Blood Pressure 96 mmHg  HI     Heart Rate Monitored 106 bpm  HI     Respiratory Rate 18 br/min     SpO2 96 %    03/28/2020 13:41 EST Systolic Blood Pressure 120 mmHg     Diastolic Blood Pressure 91 mmHg  HI  Heart Rate Monitored 104 bpm  HI (Modified)    Respiratory Rate 22 br/min  HI     SpO2 98 %    03/28/2020 12:00 EST Systolic Blood Pressure 148 mmHg  HI     Diastolic Blood Pressure 91 mmHg  HI     Temperature Oral 36.6 degC     Heart Rate Monitored 96 bpm     Respiratory Rate 20 br/min     SpO2 97 %    .   Measurements   03/28/2020 12:02 EST Body Mass Index est meas 32.42 kg/m2    Body Mass Index Measured 32.42 kg/m2   03/28/2020 12:00 EST Height/Length Measured 160 cm    Weight Dosing 83 kg   .   Basic Oxygen Information   03/28/2020 15:36 EST Oxygen Therapy Room air    SpO2 96 %   03/28/2020 13:41 EST Oxygen Therapy Room air    SpO2 98 %   03/28/2020 12:00 EST Oxygen Therapy Room air    SpO2 97 %   .   General:  Alert, no acute distress.    Skin:  Warm, dry, pink.    Head:  Normocephalic, atraumatic.    Neck:  Supple, trachea midline, no tenderness.    Eye:  Pupils are equal, round and reactive to light, extraocular movements are intact, normal conjunctiva.    Ears, nose, mouth and throat:  Oral mucosa moist.   Cardiovascular:  Regular rate and rhythm, No murmur, Normal peripheral perfusion.    Respiratory:  Breath sounds are equal, Respirations: Tachypneic, respiratory distress mild,  Breath sounds: Bilateral, wheezes present moderate, Retractions: None.    Chest wall:  No tenderness, No deformity.    Back:  Nontender, Normal range of motion.    Musculoskeletal:  Normal ROM, normal strength, no swelling.    Gastrointestinal:  Soft, Nontender, Normal bowel sounds, No organomegaly.    Neurological:  Alert and oriented to person, place, time, and situation, No focal neurological deficit observed, CN II-XII intact, normal sensory observed, normal motor observed.    Lymphatics:  No lymphadenopathy.   Psychiatric:  Cooperative, appropriate mood & affect.       Reexamination/ Reevaluation   Vital signs   Basic Oxygen Information   03/28/2020 15:36 EST Oxygen Therapy Room air    SpO2 96 %   03/28/2020 13:41 EST Oxygen Therapy Room air    SpO2 98 %   03/28/2020 12:00 EST Oxygen Therapy Room air    SpO2 97 %      Patient was given an hour-long albuterol Atrovent breathing treatment.  She reports significant improvement in her symptoms.  Reevaluation shows the patient to have resolved her tachypnea and her wheezes have completely resolved.  Patient did receive prednisone 60 mg in the emergency department.  Ultimately the patient stable for discharge home I will represcribed albuterol inhaler for her.  Patient was discharged home.      Impression and Plan   Diagnosis   Asthma exacerbation (ICD10-CM J45.901, Discharge, Medical)   Plan   Prescriptions: [facility] prescriptions   Pharmacy:  predniSONE 20 mg oral tablet (Prescribe): 20 mg, 1 tabs, Oral, Daily, for 5 days, 5 tabs, 0 Refill(s)  Proventil HFA 90 mcg/inh inhalation aerosol (Prescribe): 1 puffs, Inhale, q4hr, PRN: shortness of breath or wheezing, 6.7 g, 0 Refill(s).    Patient was given the following educational materials: Asthma, Adult, Asthma, Adult.    Follow up with: Follow-up with your primary care physician Within 1 week; Return  immediately to the emergency department if you develop chest pain, shortness of breath, lightheadedness, fever, vomiting or  as needed..    Counseled: Patient.    Signature Line     Electronically Signed on 03/28/2020 05:12 PM EST   ________________________________________________   Jackquline Bosch,  Lovel Suazo HOWARD-DO               Modified byJackquline Bosch,  Chalonda Schlatter HOWARD-DO on 03/28/2020 05:12 PM EST

## 2020-03-28 NOTE — ED Notes (Signed)
ED Triage Note       ED Triage Adult Entered On:  03/28/2020 12:02 EST    Performed On:  03/28/2020 12:00 EST by BELLEW, RN, KRISTY M               Triage   Numeric Rating Pain Scale :   0 = No pain   Chief Complaint :   Pt states she has asthma and became SOB yesterday, used her inhaler but states it is not the one she normally uses.    [place] Mode of Arrival :   Walking   Infectious Disease Documentation :   Document assessment   Temperature Oral :   36.6 degC(Converted to: 97.9 degF)    Heart Rate Monitored :   96 bpm   Respiratory Rate :   20 br/min   Systolic Blood Pressure :   148 mmHg (HI)    Diastolic Blood Pressure :   91 mmHg (HI)    SpO2 :   97 %   Oxygen Therapy :   Room air   Patient presentation :   None of the above   Chief Complaint or Presentation suggest infection :   No   Weight Dosing :   83 kg(Converted to: 183 lb 0 oz)    Height :   160 cm(Converted to: 5 ft 3 in)    Body Mass Index Dosing :   32 kg/m2   BELLEW, RN, KRISTY M - 03/28/2020 12:00 EST   DCP GENERIC CODE   Tracking Acuity :   3   Tracking Group :   ED [id number] [employer]   [place], RN, Soyla Dryer - 03/28/2020 12:00 EST   ED General Section :   Document assessment   Pregnancy Status :   Patient denies   ED Allergies Section :   Document assessment   ED Reason for Visit Section :   Document assessment   ED Home Meds Section :   Document assessment   BELLEW, RN, KRISTY M - 03/28/2020 12:00 EST   ID Risk Screen Symptoms   Recent Travel History :   No recent travel   Close Contact with COVID-19 ID :   No   Last 14 days COVID-19 ID :   No   BELLEW, RN, KRISTY M - 03/28/2020 12:00 EST   Allergies   (As Of: 03/28/2020 12:02:56 EST)   Allergies (Active)   No Known Medication Allergies  Estimated Onset Date:   Unspecified ; Created ByZadie Rhine, RN, KRISTY M; Reaction Status:   Active ; Category:   Drug ; Substance:   No Known Medication Allergies ; Type:   Allergy ; Updated By:   Elder Love; Reviewed Date:   03/28/2020 12:01 EST         Psycho-Social   Last 3 mo, thoughts killing self/others :   Patient denies   Right click within box for Suspected Abuse policy link. :   None   Feels Safe Where Live :   Yes   BELLEW, RN, KRISTY M - 03/28/2020 12:00 EST   ED Home Med List   Medication List   (As Of: 03/28/2020 12:02:56 EST)   Home Meds    albuterol  :   albuterol ; Status:   Documented ; Ordered As Mnemonic:   ProAir HFA 90 mcg/inh inhalation aerosol ; Simple Display Line:   puffs, q6hr, 0 Refill(s) ; Catalog Code:   albuterol ; Order Dt/Tm:  03/28/2020 12:02:08 EST            ED Reason for Visit   (As Of: 03/28/2020 12:02:56 EST)   Problems(Active)    Asthma (SNOMED CT  :154008676 )  Name of Problem:   Asthma ; Recorder:   BELLEW, RN, KRISTY M; Confirmation:   Confirmed ; Classification:   Patient Stated ; Code:   [id number] ; Contributor System:   Dietitian ; Last Updated:   03/28/2020 12:02 EST ; Life Cycle Date:   03/28/2020 ; Life Cycle Status:   Active ; Vocabulary:   SNOMED CT          Diagnoses(Active)    Asthma flareup  Date:   03/28/2020 ; Diagnosis Type:   Reason For Visit ; Confirmation:   Complaint of ; Clinical Dx:   Asthma flareup ; Classification:   Medical ; Clinical Service:   Emergency medicine ; Code:   PNED ; Probability:   0 ; Diagnosis Code:   [id number]

## 2020-03-28 NOTE — ED Notes (Signed)
 ED Patient Summary       ;       [facility] Emergency Department  [address], [place] [address]  [phone]  Discharge Instructions (Patient)  Name: [name] [name]  DOB: 06-Dec-2000                   MRN: [id number]                   FIN: [id number]  Reason For Visit: Asthma flareup; SOB 94%  Final Diagnosis: Asthma exacerbation     Visit Date: 03/28/2020 11:40:00  Address: [address] [place] [address]  Phone: [phone]     Emergency Department Providers:         Primary Physician:      SHERRYLE, ADAM HOWARD-DO      [facility] would like to thank you for allowing us  to assist you with your healthcare needs. The following includes patient education materials and information regarding your injury/illness.     Follow-up Instructions:  You were seen today on an emergency basis. Please contact your primary care doctor for a follow up appointment. If you received a referral to a specialist doctor, it is important you follow-up as instructed.    It is important that you call your follow-up doctor to schedule and confirm the location of your next appointment. Your doctor may practice at multiple locations. The office location of your follow-up appointment may be different to the one written on your discharge instructions.    If you do not have a primary care doctor, please call [phone] for help in finding a [name]. [facility] Provider. For help in finding a specialist doctor, please call [phone].    If your condition gets worse before your follow-up with your primary care doctor or specialist, please return to the Emergency Department.      Coronavirus 2019 (COVID-19) Reminders:     Patients age 47 - 88, with parental consent, and patients over age 61 can make an appointment for a COVID-19 vaccine. Patients can contact their [name] doctors' offices to schedule an appointment to receive the COVID-19 vaccine. Patients who do not  have a [name] physician can call [phone] to schedule vaccination appointments.      Follow Up Appointments:  Primary Care Provider:     Name: PCP,  NONE     Phone:                  With: Address: When:   Return immediately to the emergency department if you develop chest pain, shortness of breath, lightheadedness, fever, vomiting or as needed.         With: Address: When:   Follow-up with your primary care physician  Within 1 week                   New Medications  Printed Prescriptions  predniSONE  (predniSONE  20 mg oral tablet) 1 Tabs Oral (given by mouth) every day for 5 Days. Refills: 0.  Last Dose:____________________  Medications That Were Updated - Follow Current Instructions  Printed Prescriptions  Current albuterol  (Proventil  HFA 90 mcg/inh inhalation aerosol) 1 Puffs Inhale (breathe in) every 4 hours as needed shortness of breath or wheezing. Refills: 0.  Last Dose:____________________    Other Medications  Current albuterol  (ProAir  HFA 90 mcg/inh inhalation aerosol) every 6 hours.  Last Dose:____________________  Allergy Info: No Known Medication Allergies     Discharge Additional Information          Discharge Patient 03/28/20 15:44:00 EST      Patient Education Materials:        Asthma, Adult      Asthma is a long-term (chronic) condition that causes recurrent episodes in which the airways become tight and narrow. The airways are the passages that lead from the nose and mouth down into the lungs. Asthma episodes, also called asthma attacks, can cause coughing, wheezing, shortness of breath, and chest pain. The airways can also fill with mucus. During an attack, it can be difficult to breathe. Asthma attacks can range from minor to life threatening.    Asthma cannot be cured, but medicines and lifestyle changes can help control it and treat acute attacks.      What are the causes?    This condition is believed to be caused by inherited (genetic) and environmental factors, but its  exact cause is not known.    There are many things that can bring on an asthma attack or make asthma symptoms worse (triggers). Asthma triggers are different for each person. Common triggers include:   Mold.     Dust.     Cigarette smoke.     Cockroaches.     Things that can cause allergy symptoms (allergens), such as animal dander or pollen from trees or grass.     Air pollutants such as household cleaners, wood smoke, smog, or Therapist, occupational.     Cold air, weather changes, and winds (which increase molds and pollen in the air).     Strong emotional expressions such as crying or laughing hard.     Stress.     Certain medicines (such as aspirin) or types of medicines (such as beta-blockers).     Sulfites in foods and drinks. Foods and drinks that may contain sulfites include dried fruit, potato chips, and sparkling grape juice.     Infections or inflammatory conditions such as the flu, a cold, or inflammation of the nasal membranes (rhinitis).     Gastroesophageal reflux disease (GERD).     Exercise or strenuous activity.        What are the signs or symptoms?    Symptoms of this condition may occur right after asthma is triggered or many hours later. Symptoms include:   Wheezing. This can sound like whistling when you breathe.     Excessive nighttime or early morning coughing.     Frequent or severe coughing with a common cold.     Chest tightness.     Shortness of breath.     Tiredness (fatigue) with minimal activity.        How is this diagnosed?    This condition is diagnosed based on:   Your medical history.     A physical exam.     Tests, which may include:  ? Lung function studies and pulmonary studies (spirometry). These tests can evaluate the flow of air in your lungs.    ? Allergy tests.    ? Imaging tests, such as X-rays.          How is this treated?    There is no cure for this condition, but treatment can help control your symptoms. Treatment for asthma usually involves:   Identifying and avoiding your  asthma triggers.     Using medicines to control your symptoms. Generally, two types of medicines are  used to treat asthma:  ? Controller medicines. These help prevent asthma symptoms from occurring. They are usually taken every day.    ? Fast-acting reliever or rescue medicines. These quickly relieve asthma symptoms by widening the narrow and tight airways. They are used as needed and provide short-term relief.       Using supplemental oxygen. This may be needed during a severe episode.     Using other medicines, such as:  ? Allergy medicines, such as antihistamines, if your asthma attacks are triggered by allergens.    ? Immune medicines (immunomodulators). These are medicines that help control the immune system.       Creating an asthma action plan. An asthma action plan is a written plan for managing and treating your asthma attacks. This plan includes:  ? A list of your asthma triggers and how to avoid them.    ? Information about when medicines should be taken and when their dosage should be changed.    ? Instructions about using a device called a peak flow meter. A peak flow meter measures how well the lungs are working and the severity of your asthma. It helps you monitor your condition.          Follow these instructions at home:    Controlling your home environment    Control your home environment in the following ways to help avoid triggers and prevent asthma attacks:   Change your heating and air conditioning filter regularly.     Limit your use of fireplaces and wood stoves.     Get rid of pests (such as roaches and mice) and their droppings.     Throw away plants if you see mold on them.     Clean floors and dust surfaces regularly. Use unscented cleaning products.     Try to have someone else vacuum for you regularly. Stay out of rooms while they are being vacuumed and for a short while afterward. If you vacuum, use a dust mask from a hardware store, a double-layered or microfilter vacuum cleaner bag,  or a vacuum cleaner with a HEPA filter.     Replace carpet with wood, tile, or vinyl flooring. Carpet can trap dander and dust.     Use allergy-proof pillows, mattress covers, and box spring covers.     Keep your bedroom a trigger-free room.      Avoid pets and keep windows closed when allergens are in the air.     Wash beddings every week in hot water and dry them in a dryer.     Use blankets that are made of polyester or cotton.     Clean bathrooms and kitchens with bleach. If possible, have someone repaint the walls in these rooms with mold-resistant paint. Stay out of the rooms that are being cleaned and painted.     Wash your hands often with soap and water. If soap and water are not available, use hand sanitizer.     Do not allow anyone to smoke in your home.        General instructions     Take over-the-counter and prescription medicines only as told by your health care provider.  ? Speak with your health care provider if you have questions about how or when to take the medicines.    ? Make note if you are requiring more frequent dosages.       Do not use any products that contain nicotine or tobacco, such as cigarettes  and e-cigarettes. If you need help quitting, ask your health care provider. Also, avoid being exposed to secondhand smoke.     Use a peak flow meter as told by your health care provider. Record and keep track of the readings.     Understand and use the asthma action plan to help minimize, or stop an asthma attack, without needing to seek medical care.     Make sure you stay up to date on your yearly vaccinations as told by your health care provider. This may include vaccines for the flu and pneumonia.     Avoid outdoor activities when allergen counts are high and when air quality is low.     Wear a ski mask that covers your nose and mouth during outdoor winter activities. Exercise indoors on cold days if you can.     Warm up before exercising, and take time for a cool-down period after  exercise.     Keep all follow-up visits as told by your health care provider. This is important.        Where to find more information     For information about asthma, turn to the Centers for Disease Control and Prevention at http://www.mills-berg.com/.htm     For air quality information, turn to [employer] at [employer].no      Contact a health care provider if:     You have wheezing, shortness of breath, or a cough even while you are taking medicine to prevent attacks.     The mucus you cough up (sputum) is thicker than usual.     Your sputum changes from clear or white to yellow, green, gray, or bloody.     Your medicines are causing side effects, such as a rash, itching, swelling, or trouble breathing.     You need to use a reliever medicine more than 2?3 times a week.     Your peak flow reading is still at 50?79% of your personal best after following your action plan for 1 hour.     You have a fever.      Get help right away if:     You are getting worse and do not respond to treatment during an asthma attack.     You are short of breath when at rest or when doing very little physical activity.     You have difficulty eating, drinking, or talking.     You have chest pain or tightness.     You develop a fast heartbeat or palpitations.     You have a bluish color to your lips or fingernails.     You are light-headed or dizzy, or you faint.     Your peak flow reading is less than 50% of your personal best.     You feel too tired to breathe normally.      Summary     Asthma is a long-term (chronic) condition that causes recurrent episodes in which the airways become tight and narrow. These episodes can cause coughing, wheezing, shortness of breath, and chest pain.     Asthma cannot be cured, but medicines and lifestyle changes can help control it and treat acute attacks.     Make sure you understand how to avoid triggers and how and when to use your medicines.     Asthma attacks can range from minor to life  threatening. Get help right away if you have an asthma attack and do not respond to treatment with your usual  rescue medicines.      This information is not intended to replace advice given to you by your health care provider. Make sure you discuss any questions you have with your health care provider.      Document Revised: 05/26/2019 Document Reviewed: 05/26/2019  Elsevier Patient Education ? 2021 Elsevier Inc.      ---------------------------------------------------------------------------------------------------------------------  [name]) encourages you to self-enroll in the [facility] Patient Portal.  [facility] Patient Portal will allow you to manage your personal health information securely from your own electronic device now and in the future.  To begin your Patient Portal enrollment process, please visit [email or web address] Click on "Sign up now" under [facility].  If you find that you need additional assistance on the [facility] Patient Portal or need a copy of your medical records, please call the [facility] Medical Records Office at [phone].  Comment:

## 2020-03-28 NOTE — Discharge Summary (Signed)
ED Clinical Summary                     [facility]  [address]  [place], [place], [address]  [phone]          PERSON INFORMATION  Name: [name] [name], [name] [name] Age:  20 Years DOB: 2000-12-23   Sex: Female Language: English PCP: PCP,  NONE   Marital Status: Single Phone: [phone] Med Service: [facility]   MRN: [id number] Acct# [id number] Arrival: 03/28/2020 11:40:00   Visit Reason: Asthma flareup; SOB 94% Acuity: 3 LOS: 000 04:10   Address:    [address] [place] [address]   Diagnosis:    Asthma exacerbation  Medications:          New Medications  Printed Prescriptions  predniSONE (predniSONE 20 mg oral tablet) 1 Tabs Oral (given by mouth) every day for 5 Days. Refills: 0.  Last Dose:____________________  Medications That Were Updated - Follow Current Instructions  Printed Prescriptions  Current albuterol (Proventil HFA 90 mcg/inh inhalation aerosol) 1 Puffs Inhale (breathe in) every 4 hours as needed shortness of breath or wheezing. Refills: 0.  Last Dose:____________________    Other Medications  Current albuterol (ProAir HFA 90 mcg/inh inhalation aerosol) every 6 hours.  Last Dose:____________________        Medications Administered During Visit:                Medication Dose Route   albuterol 10 mg NEB   ipratropium 0.5 mg NEB   predniSONE 60 mg Oral               Allergies      No Known Medication Allergies      Major Tests and Procedures:  The following procedures and tests were performed during your ED visit.  COMMON PROCEDURES%>  COMMON PROCEDURES COMMENTS%>                PROVIDER INFORMATION               Provider Role Assigned Kara Dies, RNDante Gang ED Nurse 03/28/2020 13:01:26    MANDEL, ADAM HOWARD-DO ED Provider 03/28/2020 13:05:37        Attending Physician:  Jackquline Bosch,  ADAM HOWARD-DO      Admit Doc  MANDEL,  ADAM HOWARD-DO     Consulting Doc       VITALS INFORMATION  Vital Sign Triage Latest   Temp Oral ORAL_1%> ORAL%>   Temp Temporal TEMPORAL_1%>  TEMPORAL%>   Temp Intravascular INTRAVASCULAR_1%> INTRAVASCULAR%>   Temp Axillary AXILLARY_1%> AXILLARY%>   Temp Rectal RECTAL_1%> RECTAL%>   02 Sat 97 % 96 %   Respiratory Rate RATE_1%> RATE%>   Peripheral Pulse Rate PULSE RATE_1%> PULSE RATE%>   Apical Heart Rate HEART RATE_1%> HEART RATE%>   Blood Pressure BLOOD PRESSURE_1%>/ BLOOD PRESSURE_1%>91 mmHg BLOOD PRESSURE%> / BLOOD PRESSURE%>96 mmHg                 Immunizations      No Immunizations Documented This Visit          DISCHARGE INFORMATION   Discharge Disposition: H Outpt-Sent Home   Discharge Location:  Home   Discharge Date and Time:  03/28/2020 15:50:12   ED Checkout Date and Time:  03/28/2020 15:50:12     DEPART REASON INCOMPLETE INFORMATION               Depart Action Incomplete Reason  Interactive View/I&O Recently assessed               Problems      No Problems Documented              Smoking Status      No Smoking Status Documented         PATIENT EDUCATION INFORMATION  Instructions:     Asthma, Adult     Follow up:                   With: Address: When:   Return immediately to the emergency department if you develop chest pain, shortness of breath, lightheadedness, fever, vomiting or as needed.         With: Address: When:   Follow-up with your primary care physician  Within 1 week              ED PROVIDER DOCUMENTATION

## 2020-04-04 DIAGNOSIS — Z419 Encounter for procedure for purposes other than remedying health state, unspecified: Secondary | ICD-10-CM | POA: Diagnosis not present

## 2020-05-05 DIAGNOSIS — Z419 Encounter for procedure for purposes other than remedying health state, unspecified: Secondary | ICD-10-CM | POA: Diagnosis not present

## 2020-05-13 ENCOUNTER — Other Ambulatory Visit: Payer: Self-pay

## 2020-05-13 ENCOUNTER — Encounter (HOSPITAL_COMMUNITY): Payer: Self-pay

## 2020-05-13 ENCOUNTER — Emergency Department (HOSPITAL_COMMUNITY)
Admission: EM | Admit: 2020-05-13 | Discharge: 2020-05-13 | Disposition: A | Discharge status: Home / Self Care | Payer: Medicaid Other | Attending: Emergency Medicine | Admitting: Emergency Medicine

## 2020-05-13 DIAGNOSIS — J4521 Mild intermittent asthma with (acute) exacerbation: Secondary | ICD-10-CM | POA: Insufficient documentation

## 2020-05-13 DIAGNOSIS — Z8616 Personal history of COVID-19: Secondary | ICD-10-CM | POA: Diagnosis not present

## 2020-05-13 DIAGNOSIS — Z7722 Contact with and (suspected) exposure to environmental tobacco smoke (acute) (chronic): Secondary | ICD-10-CM | POA: Diagnosis not present

## 2020-05-13 DIAGNOSIS — R0602 Shortness of breath: Secondary | ICD-10-CM | POA: Diagnosis not present

## 2020-05-13 DIAGNOSIS — Z743 Need for continuous supervision: Secondary | ICD-10-CM | POA: Diagnosis not present

## 2020-05-13 DIAGNOSIS — Z7952 Long term (current) use of systemic steroids: Secondary | ICD-10-CM | POA: Insufficient documentation

## 2020-05-13 DIAGNOSIS — O99519 Diseases of the respiratory system complicating pregnancy, unspecified trimester: Secondary | ICD-10-CM

## 2020-05-13 DIAGNOSIS — J45909 Unspecified asthma, uncomplicated: Secondary | ICD-10-CM

## 2020-05-13 MED ORDER — PREDNISONE 20 MG PO TABS: 40.0000 mg | ORAL_TABLET | Freq: Every day | ORAL | 0 refills | Status: AC

## 2020-05-13 MED ORDER — ALBUTEROL SULFATE HFA 108 (90 BASE) MCG/ACT IN AERS
2.0000 | INHALATION_SPRAY | RESPIRATORY_TRACT | Status: DC | PRN
  Administered 2020-05-13: 2 via RESPIRATORY_TRACT
  Filled 2020-05-13: qty 6.7

## 2020-05-13 MED ORDER — AEROCHAMBER PLUS FLO-VU MEDIUM MISC
1.0000 | Freq: Once | Status: AC
  Administered 2020-05-13: 1
  Filled 2020-05-13 (×2): qty 1

## 2020-05-13 MED ORDER — CETIRIZINE HCL 10 MG PO TABS: 10.0000 mg | ORAL_TABLET | Freq: Every day | ORAL | 0 refills | Status: AC

## 2020-05-13 MED ORDER — FLUTICASONE PROPIONATE 50 MCG/ACT NA SUSP: 2.0000 | Freq: Every day | NASAL | 0 refills | Status: AC

## 2020-05-13 MED ORDER — ALBUTEROL SULFATE HFA 108 (90 BASE) MCG/ACT IN AERS: 2.0000 | INHALATION_SPRAY | Freq: Four times a day (QID) | RESPIRATORY_TRACT | 2 refills | Status: DC | PRN

## 2020-05-13 NOTE — ED Triage Notes (Signed)
Pt to ED by EMS from home with c/o asthma exacerbation. Pt with extensive hx of asthma. Received 125 solumedrol, 10mg  albuterol, 0.5 atrovent, 2g mag, by EMS, condition has greatly improved. Pt noted to have expiratory wheezing. Arrives A+O, VSS, NADN, speaking in complete sentences.

## 2020-05-13 NOTE — ED Provider Notes (Signed)
[facility] DEPT Provider Note   CSN: [id number] Arrival date & time: 05/13/20  0047     History Chief Complaint  Patient presents with  . Asthma    Amanda Glover is a 20 y.o. female presents to the Emergency Department complaining of gradual, persistent, progressively worsening as of breath onset several hours prior to arrival.  Patient reports a history of recurrent asthma exacerbations.  She does not have a primary care provider and her asthma is not routinely managed.  She reports she ran out of her inhaler that she was given at the ER last time she had an exacerbation.  She reports her last prescription of prednisone was early February.  Given her shortness of breath patient contacted EMS.  She was found to have inspiratory and expiratory wheezing.  She was given 125 mg of Solu-Medrol, 10 mg of albuterol, 0.5 mg of Atrovent and 2g of magnesium by EMS with significant improvement.  Patient reports she feels fully back to normal at this time and wishes to be discharged home with prescriptions.  She is not taking any allergy medicines.  Reports intermittent congestion and sneezing but denies cough, fever, nausea, vomiting, diarrhea, weakness, dizziness, syncope.   The history is provided by the patient and medical records. No language interpreter was used.       Past Medical History:  Diagnosis Date  . Asthma   . Mental disorder     Patient Active Problem List   Diagnosis Date Noted  . Vaginal delivery 02/14/2020  . COVID 02/14/2020  . Asthma 10/19/2019    Past Surgical History:  Procedure Laterality Date  . TONSILLECTOMY       OB History    Gravida  1   Para      Term      Preterm      AB  0   Living        SAB  0   IAB  0   Ectopic  0   Multiple      Live Births              Family History  Problem Relation Age of Onset  . Asthma Mother     Social History   Tobacco Use  . Smoking status: Passive Smoke  Exposure - Never Smoker  . Smokeless tobacco: Never Used  Vaping Use  . Vaping Use: Never used  Substance Use Topics  . Alcohol use: Not Currently  . Drug use: Not Currently    Home Medications Prior to Admission medications   Medication Sig Start Date End Date Taking? Authorizing Provider  cetirizine (ZYRTEC ALLERGY) 10 MG tablet Take 1 tablet (10 mg total) by mouth daily. 05/13/20 06/12/20 Yes Lachrisha Ziebarth, Jarrett Soho, PA-C  fluticasone (FLONASE) 50 MCG/ACT nasal spray Place 2 sprays into both nostrils daily. 05/13/20  Yes Gillis Boardley, Jarrett Soho, PA-C  predniSONE (DELTASONE) 20 MG tablet Take 2 tablets (40 mg total) by mouth daily. 05/13/20  Yes Temperence Zenor, Jarrett Soho, PA-C  acetaminophen (TYLENOL) 500 MG tablet Take 2 tablets (1,000 mg total) by mouth every 6 (six) hours as needed (for pain scale < 4). 2/40/97   Arrie Senate, MD  albuterol (VENTOLIN HFA) 108 (90 Base) MCG/ACT inhaler Inhale 2 puffs into the lungs every 6 (six) hours as needed for wheezing or shortness of breath. 05/13/20   Marvens Hollars, Jarrett Soho, PA-C  coconut oil OIL Apply 1 application topically as needed. 3/53/29   Arrie Senate, MD  ferrous gluconate [facility])  324 MG tablet Take 1 tablet (324 mg total) by mouth every other day. 1/51/76   Arrie Senate, MD  ibuprofen (ADVIL) 600 MG tablet Take 1 tablet (600 mg total) by mouth every 6 (six) hours. 1/60/73   Arrie Senate, MD  Prenatal Vit-Fe Fumarate-FA (PREPLUS) 27-1 MG TABS Take 1 tablet by mouth daily. 10/19/19   Lajean Manes, CNM  vitamin C (VITAMIN C) 250 MG tablet Take 1 tablet (250 mg total) by mouth every other day. Take with iron. 08/13/60   Arrie Senate, MD    Allergies    Other and Watermelon flavor  Review of Systems   Review of Systems  Constitutional: Negative for appetite change, diaphoresis, fatigue, fever and unexpected weight change.  HENT: Positive for congestion, postnasal drip and rhinorrhea. Negative for mouth sores.   Eyes:  Negative for visual disturbance.  Respiratory: Positive for chest tightness, shortness of breath and wheezing. Negative for cough.   Cardiovascular: Negative for chest pain.  Gastrointestinal: Negative for abdominal pain, constipation, diarrhea, nausea and vomiting.  Endocrine: Negative for polydipsia, polyphagia and polyuria.  Genitourinary: Negative for dysuria, frequency, hematuria and urgency.  Musculoskeletal: Negative for back pain and neck stiffness.  Skin: Negative for rash.  Allergic/Immunologic: Negative for immunocompromised state.  Neurological: Negative for syncope, light-headedness and headaches.  Hematological: Does not bruise/bleed easily.  Psychiatric/Behavioral: Negative for sleep disturbance. The patient is not nervous/anxious.     Physical Exam Updated Vital Signs BP 131/83 (BP Location: Right Arm)   Pulse 97   Temp 98.1 F (36.7 C) (Oral)   Resp 18   Ht 5\' 4"  (1.626 m)   Wt 90 kg   LMP 05/06/2020 (Approximate)   SpO2 100%   BMI 34.06 kg/m   Physical Exam Vitals and nursing note reviewed.  Constitutional:      General: She is not in acute distress.    Appearance: She is not diaphoretic.  HENT:     Head: Normocephalic.  Eyes:     General: No scleral icterus.    Conjunctiva/sclera: Conjunctivae normal.  Cardiovascular:     Rate and Rhythm: Normal rate and regular rhythm.     Pulses: Normal pulses.          Radial pulses are 2+ on the right side and 2+ on the left side.  Pulmonary:     Effort: Pulmonary effort is normal. No tachypnea, accessory muscle usage, prolonged expiration, respiratory distress or retractions.     Breath sounds: No stridor. Wheezing ( Faint, expiratory in the left upper field) present.     Comments: Equal chest rise. No increased work of breathing. Abdominal:     General: There is no distension.     Palpations: Abdomen is soft.     Tenderness: There is no abdominal tenderness. There is no guarding or rebound.  Musculoskeletal:      Cervical back: Normal range of motion.     Comments: Moves all extremities equally and without difficulty.  Skin:    General: Skin is warm and dry.     Capillary Refill: Capillary refill takes less than 2 seconds.  Neurological:     Mental Status: She is alert.     GCS: GCS eye subscore is 4. GCS verbal subscore is 5. GCS motor subscore is 6.     Comments: Speech is clear and goal oriented.  Psychiatric:        Mood and Affect: Mood normal.     ED Results / Procedures /  Treatments    Procedures Procedures   Medications Ordered in ED Medications  albuterol (VENTOLIN HFA) 108 (90 Base) MCG/ACT inhaler 2 puff (has no administration in time range)  AeroChamber Plus Flo-Vu Medium MISC 1 each (has no administration in time range)    ED Course  I have reviewed the triage vital signs and the nursing notes.  Pertinent labs & imaging results that were available during my care of the patient were reviewed by me and considered in my medical decision making (see chart for details).    MDM Rules/Calculators/A&P                           Presents with asthma exacerbation.  Aggressive treatment by EMS prior to arrival.  Patient reports she is feeling better at this time.  Almost no wheezing on my exam.  No hypoxia.  Patient is not tachycardic.  No infectious symptoms to suggest pneumonia or COVID-19.  Patient is not vaccinated.  Discussed importance of primary care management.  Will give burst of prednisone and albuterol here in the emergency department.  Also discussed use of Zyrtec and Flonase.  Patient stated understanding and is in agreement with the plan.   Final Clinical Impression(s) / ED Diagnoses Final diagnoses:  Mild intermittent asthma with exacerbation    Rx / DC Orders ED Discharge Orders         Ordered    albuterol (VENTOLIN HFA) 108 (90 Base) MCG/ACT inhaler  Every 6 hours PRN        05/13/20 0239    predniSONE (DELTASONE) 20 MG tablet  Daily        05/13/20 0239     cetirizine (ZYRTEC ALLERGY) 10 MG tablet  Daily        05/13/20 0239    fluticasone (FLONASE) 50 MCG/ACT nasal spray  Daily        05/13/20 0239           Phoebe Marter, Gwenlyn Perking 05/13/20 [place], Grayce Sessions, MD 05/14/20 [phone]

## 2020-05-13 NOTE — Discharge Instructions (Addendum)
1. Medications: albuterol, prednisone, flonase; usual home medications 2. Treatment: rest, drink plenty of fluids, begin OTC antihistamine (Zyrtec or Claritin)  3. Follow Up: Please followup with your primary doctor in 2-3 days for discussion of your diagnoses and further evaluation after today's visit; if you do not have a primary care doctor use the resource guide provided to find one; Please return to the ER for difficulty breathing, high fevers or worsening symptoms.

## 2020-05-15 ENCOUNTER — Telehealth: Payer: Self-pay

## 2020-05-15 NOTE — Telephone Encounter (Signed)
Transition Care Management Unsuccessful Follow-up Telephone Call  Date of discharge and from where:  05/13/2020 from [facility]  Attempts:  1st Attempt  Reason for unsuccessful TCM follow-up call:  Unable to leave message

## 2020-05-16 NOTE — Telephone Encounter (Signed)
Transition Care Management Unsuccessful Follow-up Telephone Call  Date of discharge and from where:  05/13/2020 from [facility]  Attempts:  3rd Attempt  Reason for unsuccessful TCM follow-up call:  Unable to leave message

## 2020-05-17 NOTE — Telephone Encounter (Signed)
Transition Care Management Unsuccessful Follow-up Telephone Call  Date of discharge and from where:  05/13/2020 from [facility]  Attempts:  3rd Attempt  Reason for unsuccessful TCM follow-up call:  Unable to reach patient

## 2020-06-04 DIAGNOSIS — Z419 Encounter for procedure for purposes other than remedying health state, unspecified: Secondary | ICD-10-CM | POA: Diagnosis not present

## 2020-06-19 ENCOUNTER — Other Ambulatory Visit: Payer: Self-pay

## 2020-06-19 ENCOUNTER — Emergency Department (HOSPITAL_COMMUNITY)
Admission: EM | Admit: 2020-06-19 | Discharge: 2020-06-20 | Disposition: A | Discharge status: Home / Self Care | Payer: Medicaid Other | Attending: Emergency Medicine | Admitting: Emergency Medicine

## 2020-06-19 ENCOUNTER — Encounter (HOSPITAL_COMMUNITY): Payer: Self-pay | Admitting: Emergency Medicine

## 2020-06-19 DIAGNOSIS — J45909 Unspecified asthma, uncomplicated: Secondary | ICD-10-CM

## 2020-06-19 DIAGNOSIS — J45901 Unspecified asthma with (acute) exacerbation: Secondary | ICD-10-CM | POA: Diagnosis not present

## 2020-06-19 DIAGNOSIS — Z7951 Long term (current) use of inhaled steroids: Secondary | ICD-10-CM | POA: Insufficient documentation

## 2020-06-19 DIAGNOSIS — R0602 Shortness of breath: Secondary | ICD-10-CM | POA: Diagnosis present

## 2020-06-19 DIAGNOSIS — Z8616 Personal history of COVID-19: Secondary | ICD-10-CM | POA: Insufficient documentation

## 2020-06-19 DIAGNOSIS — Z7722 Contact with and (suspected) exposure to environmental tobacco smoke (acute) (chronic): Secondary | ICD-10-CM | POA: Insufficient documentation

## 2020-06-19 DIAGNOSIS — R69 Illness, unspecified: Secondary | ICD-10-CM | POA: Diagnosis not present

## 2020-06-19 DIAGNOSIS — R5381 Other malaise: Secondary | ICD-10-CM | POA: Diagnosis not present

## 2020-06-19 DIAGNOSIS — J4541 Moderate persistent asthma with (acute) exacerbation: Secondary | ICD-10-CM | POA: Diagnosis not present

## 2020-06-19 DIAGNOSIS — O99519 Diseases of the respiratory system complicating pregnancy, unspecified trimester: Secondary | ICD-10-CM

## 2020-06-19 MED ORDER — MAGNESIUM SULFATE 50 % IJ SOLN: 1.0000 g | Freq: Once | INTRAMUSCULAR | Status: DC

## 2020-06-19 MED ORDER — PREDNISONE 20 MG PO TABS: 60.0000 mg | ORAL_TABLET | Freq: Once | ORAL | Status: DC

## 2020-06-19 MED ORDER — MAGNESIUM SULFATE IN D5W 1-5 GM/100ML-% IV SOLN
1.0000 g | Freq: Once | INTRAVENOUS | Status: DC
  Filled 2020-06-19: qty 100

## 2020-06-19 MED ORDER — ALBUTEROL SULFATE HFA 108 (90 BASE) MCG/ACT IN AERS: 2.0000 | INHALATION_SPRAY | RESPIRATORY_TRACT | Status: DC | PRN

## 2020-06-19 NOTE — ED Provider Notes (Signed)
Emergency Medicine Provider Triage Evaluation Note  Amanda Glover , a 20 y.o. female  was evaluated in triage.  Pt complains of sob.  Review of Systems  Positive: Sob, wheezing, cough Negative: Fever, cp  Physical Exam  BP (!) 145/86 (BP Location: Left Arm)   Pulse (!) 111   Temp 98.9 F (37.2 C) (Oral)   Resp 16   Ht 5\' 5"  (1.651 m)   Wt 89.8 kg   SpO2 98%   BMI 32.95 kg/m  Gen:   Awake, no distress   Resp:  Normal effort, speaking in complete sentences.  Diffused wheezes throughout MSK:   Moves extremities without difficulty  Other:    Medical Decision Making  Medically screening exam initiated at 8:16 PM.  Appropriate orders placed.  Amanda Glover was informed that the remainder of the evaluation will be completed by another provider, this initial triage assessment does not replace that evaluation, and the importance of remaining in the ED until their evaluation is complete.  Hx of asthma, developed increased wheezing and SOB since today, not adequately controlled with home rescue inhaler (x5).  Wheezes on exam.  Prior covid infection, no covid sxs.    Amanda Moras, PA-C 06/19/20 2018    Amanda Leigh, MD 06/19/20 [phone]

## 2020-06-19 NOTE — ED Triage Notes (Signed)
There patient presents from home with productive cough (green in color) and shortness of breath since 2100 last night. She has been using her inhaler, but ran out. EMS noted wheezing both inspiratory and expiratory. EMS administered 5 mg Albuterol inhaler.     EMS vitals:  130/94 BP 104 HR 20 RR 96% O2 sat on room air

## 2020-06-20 MED ORDER — ALBUTEROL SULFATE HFA 108 (90 BASE) MCG/ACT IN AERS: 2.0000 | INHALATION_SPRAY | Freq: Four times a day (QID) | RESPIRATORY_TRACT | 2 refills | Status: AC | PRN

## 2020-06-20 MED ORDER — ALBUTEROL SULFATE HFA 108 (90 BASE) MCG/ACT IN AERS
4.0000 | INHALATION_SPRAY | Freq: Once | RESPIRATORY_TRACT | Status: AC
  Administered 2020-06-20: 4 via RESPIRATORY_TRACT
  Filled 2020-06-20: qty 6.7

## 2020-06-20 MED ORDER — DEXAMETHASONE 4 MG PO TABS
16.0000 mg | ORAL_TABLET | Freq: Once | ORAL | Status: AC
  Administered 2020-06-20: 16 mg via ORAL
  Filled 2020-06-20: qty 4

## 2020-06-20 NOTE — ED Provider Notes (Signed)
[name]   CSN: [id number] Arrival date & time: 06/19/20  1948     History Chief Complaint  Patient presents with  . Shortness of Breath  . Cough    Amanda Glover is a 20 y.o. female.  20 year old female history of asthma that presents with a asthma exacerbation.  States she is out of her inhaler and thus she could take anything.  She got some treatments with EMS prior to arrival states it made her feel much better.  She states that she has not had a productive cough.  She denied a fever.  She states that this time she feels close to baseline and wants to go home to be with her newborn.  No lower extremity swelling.  No other associated symptoms.        Past Medical History:  Diagnosis Date  . Asthma   . Mental disorder     Patient Active Problem List   Diagnosis Date Noted  . Vaginal delivery 02/14/2020  . COVID 02/14/2020  . Asthma 10/19/2019    Past Surgical History:  Procedure Laterality Date  . TONSILLECTOMY       OB History    Gravida  1   Para      Term      Preterm      AB  0   Living        SAB  0   IAB  0   Ectopic  0   Multiple      Live Births              Family History  Problem Relation Age of Onset  . Asthma Mother     Social History   Tobacco Use  . Smoking status: Passive Smoke Exposure - Never Smoker  . Smokeless tobacco: Never Used  Vaping Use  . Vaping Use: Never used  Substance Use Topics  . Alcohol use: Not Currently  . Drug use: Not Currently    Home Medications Prior to Admission medications   Medication Sig Start Date End Date Taking? Authorizing Provider  acetaminophen (TYLENOL) 500 MG tablet Take 2 tablets (1,000 mg total) by mouth every 6 (six) hours as needed (for pain scale < 4). 2/42/68   Arrie Senate, MD  albuterol (VENTOLIN HFA) 108 (90 Base) MCG/ACT inhaler Inhale 2 puffs into the lungs every 6 (six) hours as needed for wheezing or  shortness of breath. 06/20/20   Alek Borges, Corene Cornea, MD  cetirizine (ZYRTEC ALLERGY) 10 MG tablet Take 1 tablet (10 mg total) by mouth daily. 05/13/20 06/12/20  Muthersbaugh, Jarrett Soho, PA-C  coconut oil OIL Apply 1 application topically as needed. 3/41/96   Arrie Senate, MD  ferrous gluconate (FERGON) 324 MG tablet Take 1 tablet (324 mg total) by mouth every other day. 03/29/95   Arrie Senate, MD  fluticasone (FLONASE) 50 MCG/ACT nasal spray Place 2 sprays into both nostrils daily. 05/13/20   Muthersbaugh, Jarrett Soho, PA-C  ibuprofen (ADVIL) 600 MG tablet Take 1 tablet (600 mg total) by mouth every 6 (six) hours. 9/89/21   Arrie Senate, MD  predniSONE (DELTASONE) 20 MG tablet Take 2 tablets (40 mg total) by mouth daily. 05/13/20   Muthersbaugh, Jarrett Soho, PA-C  Prenatal Vit-Fe Fumarate-FA (PREPLUS) 27-1 MG TABS Take 1 tablet by mouth daily. 10/19/19   Lajean Manes, CNM  vitamin C (VITAMIN C) 250 MG tablet Take 1 tablet (250 mg total) by mouth every other day. Take  with iron. 7/82/42   Arrie Senate, MD    Allergies    Other and Watermelon flavor  Review of Systems   Review of Systems  All other systems reviewed and are negative.   Physical Exam Updated Vital Signs BP 115/90 (BP Location: Left Arm)   Pulse 90   Temp 98.6 F (37 C) (Oral)   Resp 15   Ht 5\' 5"  (1.651 m)   Wt 89.8 kg   SpO2 98%   BMI 32.95 kg/m   Physical Exam Vitals and nursing note reviewed.  Constitutional:      Appearance: She is well-developed.  HENT:     Head: Normocephalic and atraumatic.     Nose: No congestion or rhinorrhea.     Mouth/Throat:     Mouth: Mucous membranes are moist.     Pharynx: Oropharynx is clear.  Eyes:     Pupils: Pupils are equal, round, and reactive to light.  Cardiovascular:     Rate and Rhythm: Normal rate and regular rhythm.  Pulmonary:     Effort: No respiratory distress.     Breath sounds: No stridor. Wheezing present.  Chest:     Chest wall: No mass or  tenderness.  Abdominal:     General: There is no distension.     Palpations: Abdomen is soft.  Musculoskeletal:        General: Normal range of motion.     Cervical back: Normal range of motion.     Right lower leg: No edema.     Left lower leg: No edema.  Skin:    General: Skin is warm and dry.  Neurological:     General: No focal deficit present.     Mental Status: She is alert.     ED Results / Procedures / Treatments   Labs (all labs ordered are listed, but only abnormal results are displayed) Labs Reviewed - No data to display  EKG None  Radiology No results found.  Procedures Procedures   Medications Ordered in ED Medications  dexamethasone (DECADRON) tablet 16 mg (16 mg Oral Given 06/20/20 0042)  albuterol (VENTOLIN HFA) 108 (90 Base) MCG/ACT inhaler 4 puff (4 puffs Inhalation Given 06/20/20 0043)    ED Course  I have reviewed the triage vital signs and the nursing notes.  Pertinent labs & imaging results that were available during my care of the patient were reviewed by me and considered in my medical decision making (see chart for details).    MDM Rules/Calculators/A&P                          Feeling much better without distress.  Decadron albuterol inhaler given.  Prescriptions for inhalers provided.  Patient this some persistent symmetric wheezing however flexion is probably her baseline.  She states that she feels close to baseline.  Stable for discharge with PCP follow-up  Final Clinical Impression(s) / ED Diagnoses Final diagnoses:  Moderate asthma with exacerbation, unspecified whether persistent    Rx / DC Orders ED Discharge Orders         Ordered    albuterol (VENTOLIN HFA) 108 (90 Base) MCG/ACT inhaler  Every 6 hours PRN        06/20/20 0047           Aissatou Fronczak, Corene Cornea, MD 06/20/20 0320

## 2020-06-21 ENCOUNTER — Telehealth: Payer: Self-pay | Admitting: *Deleted

## 2020-06-21 NOTE — Telephone Encounter (Signed)
Transition Care Management Follow-up Telephone Call  Date of discharge and from where: 06/20/2020 - [facility]  How have you been since you were released from the hospital? "Okay"  Any questions or concerns? No  Items Reviewed:  Did the pt receive and understand the discharge instructions provided? Yes   Medications obtained and verified? N/A  Other? No   Any new allergies since your discharge? No   Dietary orders reviewed? No  Do you have support at home? Yes   Functional Questionnaire: (I = Independent and D = Dependent) ADLs: I  Bathing/Dressing- I  Meal Prep- I  Eating- I  Maintaining continence- I  Transferring/Ambulation- I  Managing Meds- I  Follow up appointments reviewed:   PCP Hospital f/u appt confirmed? No    Specialist Hospital f/u appt confirmed? No    Are transportation arrangements needed? N/A  If their condition worsens, is the pt aware to call PCP or go to the Emergency Dept.? Yes  Was the patient provided with contact information for the PCP's office or ED? Yes  Was to pt encouraged to call back with questions or concerns? Yes

## 2020-07-05 DIAGNOSIS — Z419 Encounter for procedure for purposes other than remedying health state, unspecified: Secondary | ICD-10-CM | POA: Diagnosis not present

## 2020-08-04 DIAGNOSIS — Z419 Encounter for procedure for purposes other than remedying health state, unspecified: Secondary | ICD-10-CM | POA: Diagnosis not present

## 2020-09-04 DIAGNOSIS — Z419 Encounter for procedure for purposes other than remedying health state, unspecified: Secondary | ICD-10-CM | POA: Diagnosis not present

## 2020-10-05 DIAGNOSIS — Z419 Encounter for procedure for purposes other than remedying health state, unspecified: Secondary | ICD-10-CM | POA: Diagnosis not present

## 2020-11-04 DIAGNOSIS — Z419 Encounter for procedure for purposes other than remedying health state, unspecified: Secondary | ICD-10-CM | POA: Diagnosis not present

## 2020-11-13 NOTE — ED Provider Notes (Signed)
Dyspnea *ED        Patient:   [name] [name], [name] [name]             MRN: [id number]            FIN: [id number]               Age:   20 years     Sex:  Female     DOB:  2000/05/21   Associated Diagnoses:   Asthma exacerbation   Author:   Gilman Buttner MARIE-DO      Basic Information   Time seen: Provider Seen (ST)   ED Provider/Time:    Eitan Doubleday,  Eugene Zeiders MARIE-DO / 11/13/2020 02:10  .   History source: Patient.   Arrival mode: Private vehicle.   History limitation: None.   Additional information: Chief Complaint from Nursing Triage Note   Chief Complaint  Chief Complaint: pt reports sob for past few hours, audible wheezing in triage. pt reports she is out of her inhaler. (11/13/20 01:54:00).      History of Present Illness   20 year old female presents emergency department complaining of shortness of breath consistent with an asthma exacerbation.  Patient states that started earlier this afternoon.  She is actively wheezing and tachypneic.  She tried using her inhaler at home without relief.  She has been admitted to the hospital for her asthma in the past but is been years.  She has not been on steroids for at least few months.  She has never been intubated.  She does not smoke cigarettes but states that her boyfriend does.  She denies drug use or alcohol use.  She is not vaccinated for COVID-19.      Review of Systems   Constitutional symptoms:  No fever, no chills.    Skin symptoms:  No rash,    Eye symptoms:  Vision unchanged.   ENMT symptoms:  No sore throat, no nasal congestion.    Respiratory symptoms:  Negative except as documented in HPI.   Cardiovascular symptoms:  No chest pain, no palpitations.    Gastrointestinal symptoms:  No abdominal pain, no nausea, no vomiting, no diarrhea, no rectal bleeding.    Genitourinary symptoms:  No dysuria, no hematuria.    Musculoskeletal symptoms:  No back pain,    Neurologic symptoms:  No headache,              Additional review of systems information: All other systems reviewed and  otherwise negative.      Health Status   Allergies:    Allergic Reactions (Selected)  No Known Medication Allergies.   Medications:  (Selected)   Inpatient Medications  Ordered  Atrovent  0.5 mg/2.5 ml (0.02%) neb solution: 0.5 mg, 2.5 mL, NEB, Once  albuterol 2.5 mg/3 mL (0.083%) inhalation solution: 5 mg, 6 mL, NEB, Once  predniSONE: 60 mg, 3 tabs, Oral, Once.      Past Medical/ Family/ Social History   Medical history: Reviewed as documented in chart.   Surgical history: Reviewed as documented in chart.   Family history: Not significant.   Social history: Reviewed as documented in chart.   Problem list:    Active Problems (2)  Asthma   Shortness of breath   , per nurse's notes.      Physical Examination               Vital Signs   Vital Signs   11/13/2020 1:54 EDT Systolic Blood Pressure 125 mmHg  Diastolic Blood Pressure 74 mmHg    Temperature Oral 36.6 degC    Heart Rate Monitored 102 bpm  HI    Respiratory Rate 24 br/min  HI    SpO2 96 %   .   Measurements   11/13/2020 1:58 EDT Body Mass Index est meas 29.05 kg/m2    Body Mass Index Measured 29.05 kg/m2   11/13/2020 1:54 EDT Height/Length Measured 162.6 cm    Weight Dosing 76.8 kg   .   Basic Oxygen Information   11/13/2020 1:54 EDT Oxygen Therapy Room air    SpO2 96 %   .   General:  Alert, no acute distress.    Skin:  Warm, dry, intact.    Head:  Normocephalic, atraumatic.    Neck:  Supple, trachea midline.    Eye:  Extraocular movements are intact, normal conjunctiva.    Ears, nose, mouth and throat:  Oral mucosa moist.   Cardiovascular:  Normal peripheral perfusion, Tachycardia.    Respiratory:  Respirations: Tachypneic, Breath sounds: Bilateral, wheezes present moderate.    Chest wall:  No tenderness.   Back:  Normal range of motion.   Musculoskeletal:  Normal ROM, no deformity.    Gastrointestinal:  Soft, Nontender, Non distended.    Neurological:  Alert and oriented to person, place, time, and situation, No focal neurological deficit observed.        Medical Decision Making   Documents reviewed:  Emergency department nurses' notes, emergency department records, prior records.       Reexamination/ Reevaluation   Time: 11/13/2020 03:56:00 .   Notes: Improved in the ED.  Stable for discharge.      Impression and Plan   Diagnosis   Asthma exacerbation (ICD10-CM J45.901, Discharge, Medical)   Plan   Condition: Improved, Stable.    Disposition: Discharged: Time  11/13/2020 03:56:00, to home.    Prescriptions: [facility] prescriptions   Pharmacy:  predniSONE 20 mg oral tablet (Prescribe): 40 mg, 2 tabs, Oral, Daily, for 5 days, 10 tabs, 0 Refill(s)  albuterol CFC free 90 mcg/inh inhalation aerosol (Prescribe): 2 puffs, Inhale, q4hr, may administer 1 or 2 puffs every 4 -6 hours as needed, PRN: shortness of breath/wheezing, 1 EA, 0 Refill(s).    Patient was given the following educational materials: Asthma, Adult.    Follow up with: Follow up with primary care provider Within 1 week Return to ED if symptoms worsen; [facility] Within 1 week Call to establish a primary care physician if you don not have one.    Counseled: Patient, Regarding diagnosis, Regarding treatment plan, Regarding prescription, Patient indicated understanding of instructions.      Signature Line     Electronically Signed on 11/13/2020 03:57 AM EDT   ________________________________________________   [place],  Satonya Lux MARIE-DO               Modified by: Gilman Buttner MARIE-DO on 11/13/2020 03:57 AM EDT

## 2020-11-13 NOTE — Discharge Summary (Signed)
ED Clinical Summary                     [facility]  [address]  [place], [place], [address]  [phone]          PERSON INFORMATION  Name: [name] [name], [name] [name] Age:  20 Years DOB: 2000/06/01   Sex: Female Language: English PCP: PCP,  NONE   Marital Status: Single Phone: [phone] Med Service: MED-Medicine   MRN: [id number] Acct# [id number] Arrival: 11/13/2020 01:38:00   Visit Reason: Shortness of breath; ASTHMA COMPLICATIONS Acuity: 3 LOS: 000 02:20   Address:    [address] [place] [address]   Diagnosis:    Asthma exacerbation  Medications:          New Medications  Printed Prescriptions  albuterol (albuterol CFC free 90 mcg/inh inhalation aerosol) 2 Puffs Inhale (breathe in) every 4 hours as needed shortness of breath/wheezing. may administer 1 or 2 puffs every 4 -6 hours as needed. Refills: 0.  Last Dose:____________________  predniSONE (predniSONE 20 mg oral tablet) 2 Tabs Oral (given by mouth) every day for 5 Days. Refills: 0.  Last Dose:____________________      Medications Administered During Visit:                Medication Dose Route   albuterol 5 mg NEB   ipratropium 0.5 mg NEB   predniSONE 60 mg Oral               Allergies      No Known Medication Allergies      Major Tests and Procedures:  The following procedures and tests were performed during your ED visit.  COMMON PROCEDURES%>  COMMON PROCEDURES COMMENTS%>                PROVIDER INFORMATION               Provider Role Assigned Delice Lesch, RN, Reola Mosher ED Nurse 11/13/2020 02:09:58    Gilman Buttner MARIE-DO ED Provider 11/13/2020 02:10:28    Wynetta Emer[employer] ED MidLevel 11/13/2020 02:10:47 11/13/2020 02:10:49       Attending Physician:  Maple Hudson,  KELLI MARIE-DO      Admit Doc  YOUNG,  KELLI MARIE-DO     Consulting Doc       VITALS INFORMATION  Vital Sign Triage Latest   Temp Oral ORAL_1%> ORAL%>   Temp Temporal TEMPORAL_1%> TEMPORAL%>   Temp Intravascular INTRAVASCULAR_1%> INTRAVASCULAR%>    Temp Axillary AXILLARY_1%> AXILLARY%>   Temp Rectal RECTAL_1%> RECTAL%>   02 Sat 96 % 99 %   Respiratory Rate RATE_1%> RATE%>   Peripheral Pulse Rate PULSE RATE_1%> PULSE RATE%>   Apical Heart Rate HEART RATE_1%> HEART RATE%>   Blood Pressure BLOOD PRESSURE_1%>/ BLOOD PRESSURE_1%>74 mmHg BLOOD PRESSURE%> / BLOOD PRESSURE%>71 mmHg                 Immunizations      No Immunizations Documented This Visit          DISCHARGE INFORMATION   Discharge Disposition: H Outpt-Sent Home   Discharge Location:  Home   Discharge Date and Time:  11/13/2020 03:58:29   ED Checkout Date and Time:  11/13/2020 03:58:29     DEPART REASON INCOMPLETE INFORMATION               Depart Action Incomplete Reason   Interactive View/I&O Recently assessed  Problems      No Problems Documented              Smoking Status      No Smoking Status Documented         PATIENT EDUCATION INFORMATION  Instructions:     Asthma, Adult     Follow up:                   With: Address: When:   [facility] [phone] Within 1 week   Comments:   Call to establish a primary care physician if you don not have one       With: Address: When:   Follow up with primary care provider  Within 1 week   Comments:   Return to ED if symptoms worsen              ED PROVIDER DOCUMENTATION     Patient:   [name] [name], [name] [name]             MRN: [id number]            FIN: [id number]               Age:   20 years     Sex:  Female     DOB:  2000/04/13   Associated Diagnoses:   Asthma exacerbation   Author:   Gilman Buttner MARIE-DO      Basic Information   Time seen: Provider Seen (ST)   ED Provider/Time:    YOUNG,  KELLI MARIE-DO / 11/13/2020 02:10  .   History source: Patient.   Arrival mode: Private vehicle.   History limitation: None.   Additional information: Chief Complaint from Nursing Triage Note   Chief Complaint  Chief Complaint: pt reports sob for past few hours, audible wheezing in triage. pt reports she is out of her inhaler. (11/13/20 01:54:00).       History of Present Illness   20 year old female presents emergency department complaining of shortness of breath consistent with an asthma exacerbation.  Patient states that started earlier this afternoon.  She is actively wheezing and tachypneic.  She tried using her inhaler at home without relief.  She has been admitted to the hospital for her asthma in the past but is been years.  She has not been on steroids for at least few months.  She has never been intubated.  She does not smoke cigarettes but states that her boyfriend does.  She denies drug use or alcohol use.  She is not vaccinated for COVID-19.      Review of Systems   Constitutional symptoms:  No fever, no chills.    Skin symptoms:  No rash,    Eye symptoms:  Vision unchanged.   ENMT symptoms:  No sore throat, no nasal congestion.    Respiratory symptoms:  Negative except as documented in HPI.   Cardiovascular symptoms:  No chest pain, no palpitations.    Gastrointestinal symptoms:  No abdominal pain, no nausea, no vomiting, no diarrhea, no rectal bleeding.    Genitourinary symptoms:  No dysuria, no hematuria.    Musculoskeletal symptoms:  No back pain,    Neurologic symptoms:  No headache,              Additional review of systems information: All other systems reviewed and otherwise negative.      Health Status   Allergies:    Allergic Reactions (Selected)  No Known Medication Allergies.   Medications:  (  Selected)   Inpatient Medications  Ordered  Atrovent  0.5 mg/2.5 ml (0.02%) neb solution: 0.5 mg, 2.5 mL, NEB, Once  albuterol 2.5 mg/3 mL (0.083%) inhalation solution: 5 mg, 6 mL, NEB, Once  predniSONE: 60 mg, 3 tabs, Oral, Once.      Past Medical/ Family/ Social History   Medical history: Reviewed as documented in chart.   Surgical history: Reviewed as documented in chart.   Family history: Not significant.   Social history: Reviewed as documented in chart.   Problem list:    Active Problems (2)  Asthma   Shortness of breath   , per nurse's notes.       Physical Examination               Vital Signs   Vital Signs   11/13/2020 1:54 EDT Systolic Blood Pressure 125 mmHg    Diastolic Blood Pressure 74 mmHg    Temperature Oral 36.6 degC    Heart Rate Monitored 102 bpm  HI    Respiratory Rate 24 br/min  HI    SpO2 96 %   .   Measurements   11/13/2020 1:58 EDT Body Mass Index est meas 29.05 kg/m2    Body Mass Index Measured 29.05 kg/m2   11/13/2020 1:54 EDT Height/Length Measured 162.6 cm    Weight Dosing 76.8 kg   .   Basic Oxygen Information   11/13/2020 1:54 EDT Oxygen Therapy Room air    SpO2 96 %   .   General:  Alert, no acute distress.    Skin:  Warm, dry, intact.    Head:  Normocephalic, atraumatic.    Neck:  Supple, trachea midline.    Eye:  Extraocular movements are intact, normal conjunctiva.    Ears, nose, mouth and throat:  Oral mucosa moist.   Cardiovascular:  Normal peripheral perfusion, Tachycardia.    Respiratory:  Respirations: Tachypneic, Breath sounds: Bilateral, wheezes present moderate.    Chest wall:  No tenderness.   Back:  Normal range of motion.   Musculoskeletal:  Normal ROM, no deformity.    Gastrointestinal:  Soft, Nontender, Non distended.    Neurological:  Alert and oriented to person, place, time, and situation, No focal neurological deficit observed.       Medical Decision Making   Documents reviewed:  Emergency department nurses' notes, emergency department records, prior records.       Reexamination/ Reevaluation   Time: 11/13/2020 03:56:00 .   Notes: Improved in the ED.  Stable for discharge.      Impression and Plan   Diagnosis   Asthma exacerbation (ICD10-CM J45.901, Discharge, Medical)   Plan   Condition: Improved, Stable.    Disposition: Discharged: Time  11/13/2020 03:56:00, to home.    Prescriptions: Launch prescriptions   Pharmacy:  predniSONE 20 mg oral tablet (Prescribe): 40 mg, 2 tabs, Oral, Daily, for 5 days, 10 tabs, 0 Refill(s)  albuterol CFC free 90 mcg/inh inhalation aerosol (Prescribe): 2 puffs, Inhale, q4hr, may  administer 1 or 2 puffs every 4 -6 hours as needed, PRN: shortness of breath/wheezing, 1 EA, 0 Refill(s).    Patient was given the following educational materials: Asthma, Adult.    Follow up with: Follow up with primary care provider Within 1 week Return to ED if symptoms worsen; [facility] Within 1 week Call to establish a primary care physician if you don not have one.    Counseled: Patient, Regarding diagnosis, Regarding treatment plan, Regarding prescription, Patient indicated  understanding of instructions.

## 2020-11-13 NOTE — ED Notes (Signed)
ED Triage Note       ED Triage Adult Entered On:  11/13/2020 1:58 EDT    Performed On:  11/13/2020 1:54 EDT by Alger Simons, RN, Shary Decamp               Triage   Numeric Rating Pain Scale :   6   Chief Complaint :   pt reports sob for past few hours, audible wheezing in triage. pt reports she is out of her inhaler.    [place] Mode of Arrival :   Private vehicle   Infectious Disease Documentation :   Document assessment   Temperature Oral :   36.6 degC(Converted to: 97.9 degF)    Heart Rate Monitored :   102 bpm (HI)    Respiratory Rate :   24 br/min (HI)    Systolic Blood Pressure :   125 mmHg   Diastolic Blood Pressure :   74 mmHg   SpO2 :   96 %   Oxygen Therapy :   Room air   Patient presentation :   HR > 100   Chief Complaint or Presentation suggest infection :   Yes   Dosing Weight Obtained By :   Patient stated   Weight Dosing :   76.8 kg(Converted to: 169 lb 5 oz)    Height :   162.6 cm(Converted to: 5 ft 4 in)    Body Mass Index Dosing :   29 kg/m2   Hoffer, RN, Shary Decamp - 11/13/2020 1:54 EDT   DCP GENERIC CODE   Tracking Acuity :   3   Tracking Group :   [id number] [employer]   Buena, RNShary Decamp 11/13/2020 1:54 EDT   ED General Section :   Document assessment   Pregnancy Status :   Patient denies   Last Menstrual Period :   10/16/2020 EDT   ED Allergies Section :   Document assessment   ED Reason for Visit Section :   Document assessment   ED Home Meds Section :   Document assessment   ED Quick Assessment :   Patient appears awake, alert, oriented to baseline. Skin warm and dry. Moves all extremities. Respiration even and unlabored. Appears in no apparent distress.   Hoffer, RN, Shary Decamp - 11/13/2020 1:54 EDT   ID Risk Screen Symptoms   Recent Travel History :   No recent travel   TB Symptom Screen :   No symptoms   Last 90 days COVID-19 ID :   No   C. diff Symptom/History ID :   Neither of the above   Hoffer, RN, Shary Decamp - 11/13/2020 1:54 EDT   Allergies   (As Of: 11/13/2020 01:58:30 EDT)    Allergies (Active)   No Known Medication Allergies  Estimated Onset Date:   Unspecified ; Created ByZadie Rhine, RN, KRISTY M; Reaction Status:   Active ; Category:   Drug ; Substance:   No Known Medication Allergies ; Type:   Allergy ; Updated By:   Elder Love; Reviewed Date:   11/13/2020 1:55 EDT        Psycho-Social   Last 3 mo, thoughts killing self/others :   Patient denies   Right click within box for Suspected Abuse policy link. :   None   Feels Safe Where Live :   Yes   ED Behavioral Activity Rating Scale :   4 - Quiet and awake (normal level of  activity)   Hoffer, RN, Shary Decamp - 11/13/2020 1:54 EDT   ED Home Med List   Medication List   (As Of: 11/13/2020 01:58:30 EDT)   No Known Home Medications     Hoffer, RN, Shary Decamp - 11/13/2020 01:56:30      Prescription/Discharge Order    albuterol  :   albuterol ; Status:   Completed ; Ordered As Mnemonic:   Proventil HFA 90 mcg/inh inhalation aerosol ; Simple Display Line:   1 puffs, Inhale, q4hr, PRN: shortness of breath or wheezing, 6.7 g, 0 Refill(s) ; Ordering Provider:   Jackquline Bosch,  ADAM HOWARD-DO; Catalog Code:   albuterol ; Order Dt/Tm:   03/28/2020 15:42:38 EST            Home Meds    albuterol  :   albuterol ; Status:   Completed ; Ordered As Mnemonic:   ProAir HFA 90 mcg/inh inhalation aerosol ; Simple Display Line:   puffs, q6hr, 0 Refill(s) ; Catalog Code:   albuterol ; Order Dt/Tm:   03/28/2020 12:02:08 EST            ED Reason for Visit   (As Of: 11/13/2020 01:58:30 EDT)   Problems(Active)    Asthma (SNOMED CT  :154008676 )  Name of Problem:   Asthma ; Recorder:   BELLEW, RN, KRISTY M; Confirmation:   Confirmed ; Classification:   Patient Stated ; Code:   [id number] ; Contributor System:   Dietitian ; Last Updated:   03/28/2020 12:02 EST ; Life Cycle Date:   03/28/2020 ; Life Cycle Status:   Active ; Vocabulary:   SNOMED CT        Shortness of breath (IMO  :12458 )  Name of Problem:   Shortness of breath ; Recorder:   SYSTEM,  SYSTEM;  Confirmation:   Confirmed ; Classification:   Patient Stated ; Code:   09983 ; Last Updated:   03/28/2020 13:42 EST ; Life Cycle Date:   03/28/2020 ; Life Cycle Status:   Active ; Vocabulary:   IMO          Diagnoses(Active)    Shortness of breath  Date:   11/13/2020 ; Diagnosis Type:   Reason For Visit ; Confirmation:   Complaint of ; Clinical Dx:   Shortness of breath ; Classification:   Medical ; Clinical Service:   Non-Specified ; Code:   PNED ; Probability:   0 ; Diagnosis Code:   [id number]

## 2020-11-13 NOTE — ED Notes (Signed)
ED Patient Education Note     Patient Education Materials Follows:  Pulmonary Medicine     Asthma, Adult      Asthma is a long-term (chronic) condition that causes recurrent episodes in which the airways become tight and narrow. The airways are the passages that lead from the nose and mouth down into the lungs. Asthma episodes, also called asthma attacks, can cause coughing, wheezing, shortness of breath, and chest pain. The airways can also fill with mucus. During an attack, it can be difficult to breathe. Asthma attacks can range from minor to life threatening.    Asthma cannot be cured, but medicines and lifestyle changes can help control it and treat acute attacks.      What are the causes?    This condition is believed to be caused by inherited (genetic) and environmental factors, but its exact cause is not known.    There are many things that can bring on an asthma attack or make asthma symptoms worse (triggers). Asthma triggers are different for each person. Common triggers include:   Mold.     Dust.     Cigarette smoke.     Cockroaches.     Things that can cause allergy symptoms (allergens), such as animal dander or pollen from trees or grass.     Air pollutants such as household cleaners, wood smoke, smog, or chemical odors.     Cold air, weather changes, and winds (which increase molds and pollen in the air).     Strong emotional expressions such as crying or laughing hard.     Stress.     Certain medicines (such as aspirin) or types of medicines (such as beta-blockers).     Sulfites in foods and drinks. Foods and drinks that may contain sulfites include dried fruit, potato chips, and sparkling grape juice.     Infections or inflammatory conditions such as the flu, a cold, or inflammation of the nasal membranes (rhinitis).     Gastroesophageal reflux disease (GERD).     Exercise or strenuous activity.        What are the signs or symptoms?    Symptoms of this condition may occur right after asthma is  triggered or many hours later. Symptoms include:   Wheezing. This can sound like whistling when you breathe.     Excessive nighttime or early morning coughing.     Frequent or severe coughing with a common cold.     Chest tightness.     Shortness of breath.     Tiredness (fatigue) with minimal activity.        How is this diagnosed?    This condition is diagnosed based on:   Your medical history.     A physical exam.     Tests, which may include:  ? Lung function studies and pulmonary studies (spirometry). These tests can evaluate the flow of air in your lungs.    ? Allergy tests.    ? Imaging tests, such as X-rays.          How is this treated?    There is no cure for this condition, but treatment can help control your symptoms. Treatment for asthma usually involves:   Identifying and avoiding your asthma triggers.     Using medicines to control your symptoms. Generally, two types of medicines are used to treat asthma:  ? Controller medicines. These help prevent asthma symptoms from occurring. They are usually taken every day.    ?   Fast-acting reliever or rescue medicines. These quickly relieve asthma symptoms by widening the narrow and tight airways. They are used as needed and provide short-term relief.       Using supplemental oxygen. This may be needed during a severe episode.     Using other medicines, such as:  ? Allergy medicines, such as antihistamines, if your asthma attacks are triggered by allergens.    ? Immune medicines (immunomodulators). These are medicines that help control the immune system.       Creating an asthma action plan. An asthma action plan is a written plan for managing and treating your asthma attacks. This plan includes:  ? A list of your asthma triggers and how to avoid them.    ? Information about when medicines should be taken and when their dosage should be changed.    ? Instructions about using a device called a peak flow meter. A peak flow meter measures how well the lungs are  working and the severity of your asthma. It helps you monitor your condition.          Follow these instructions at home:    Controlling your home environment    Control your home environment in the following ways to help avoid triggers and prevent asthma attacks:   Change your heating and air conditioning filter regularly.     Limit your use of fireplaces and wood stoves.     Get rid of pests (such as roaches and mice) and their droppings.     Throw away plants if you see mold on them.     Clean floors and dust surfaces regularly. Use unscented cleaning products.     Try to have someone else vacuum for you regularly. Stay out of rooms while they are being vacuumed and for a short while afterward. If you vacuum, use a dust mask from a hardware store, a double-layered or microfilter vacuum cleaner bag, or a vacuum cleaner with a HEPA filter.     Replace carpet with wood, tile, or vinyl flooring. Carpet can trap dander and dust.     Use allergy-proof pillows, mattress covers, and box spring covers.     Keep your bedroom a trigger-free room.      Avoid pets and keep windows closed when allergens are in the air.     Wash beddings every week in hot water and dry them in a dryer.     Use blankets that are made of polyester or cotton.     Clean bathrooms and kitchens with bleach. If possible, have someone repaint the walls in these rooms with mold-resistant paint. Stay out of the rooms that are being cleaned and painted.     Wash your hands often with soap and water. If soap and water are not available, use hand sanitizer.     Do not allow anyone to smoke in your home.        General instructions     Take over-the-counter and prescription medicines only as told by your health care provider.  ? Speak with your health care provider if you have questions about how or when to take the medicines.    ? Make note if you are requiring more frequent dosages.       Do not use any products that contain nicotine or tobacco, such as  cigarettes and e-cigarettes. If you need help quitting, ask your health care provider. Also, avoid being exposed to secondhand smoke.     Use   a peak flow meter as told by your health care provider. Record and keep track of the readings.     Understand and use the asthma action plan to help minimize, or stop an asthma attack, without needing to seek medical care.     Make sure you stay up to date on your yearly vaccinations as told by your health care provider. This may include vaccines for the flu and pneumonia.     Avoid outdoor activities when allergen counts are high and when air quality is low.     Wear a ski mask that covers your nose and mouth during outdoor winter activities. Exercise indoors on cold days if you can.     Warm up before exercising, and take time for a cool-down period after exercise.     Keep all follow-up visits as told by your health care provider. This is important.        Where to find more information     For information about asthma, turn to the Centers for Disease Control and Prevention at www.cdc.gov/asthma/faqs.htm     For air quality information, turn to AirNow at https://airnow.gov/      Contact a health care provider if:     You have wheezing, shortness of breath, or a cough even while you are taking medicine to prevent attacks.     The mucus you cough up (sputum) is thicker than usual.     Your sputum changes from clear or white to yellow, green, gray, or bloody.     Your medicines are causing side effects, such as a rash, itching, swelling, or trouble breathing.     You need to use a reliever medicine more than 2?3 times a week.     Your peak flow reading is still at 50?79% of your personal best after following your action plan for 1 hour.     You have a fever.      Get help right away if:     You are getting worse and do not respond to treatment during an asthma attack.     You are short of breath when at rest or when doing very little physical activity.     You have difficulty  eating, drinking, or talking.     You have chest pain or tightness.     You develop a fast heartbeat or palpitations.     You have a bluish color to your lips or fingernails.     You are light-headed or dizzy, or you faint.     Your peak flow reading is less than 50% of your personal best.     You feel too tired to breathe normally.      Summary     Asthma is a long-term (chronic) condition that causes recurrent episodes in which the airways become tight and narrow. These episodes can cause coughing, wheezing, shortness of breath, and chest pain.     Asthma cannot be cured, but medicines and lifestyle changes can help control it and treat acute attacks.     Make sure you understand how to avoid triggers and how and when to use your medicines.     Asthma attacks can range from minor to life threatening. Get help right away if you have an asthma attack and do not respond to treatment with your usual rescue medicines.      This information is not intended to replace advice given to you by your health care provider. Make sure   you discuss any questions you have with your health care provider.      Document Revised: 05/26/2019 Document Reviewed: 05/26/2019  Elsevier Patient Education ? 2021 Elsevier Inc.

## 2020-11-13 NOTE — ED Notes (Signed)
ED Triage Note       ED Secondary Triage Entered On:  11/13/2020 4:00 EDT    Performed On:  11/13/2020 4:00 EDT by Cliffton Asters, RN, Reola Mosher               General Information   Barriers to Learning :   None evident   ED Home Meds Section :   Document assessment   [facility] ED Fall Risk Section :   Document assessment   ED Advance Directives Section :   Document assessment   ED Palliative Screen :   N/A (prefilled for <22yo)   Cliffton Asters RNReola Mosher - 11/13/2020 4:00 EDT   (As Of: 11/13/2020 04:00:50 EDT)   Problems(Active)    Asthma (SNOMED CT  :509326712 )  Name of Problem:   Asthma ; Recorder:   BELLEW, RN, KRISTY M; Confirmation:   Confirmed ; Classification:   Patient Stated ; Code:   [id number] ; Contributor System:   Dietitian ; Last Updated:   03/28/2020 12:02 EST ; Life Cycle Date:   03/28/2020 ; Life Cycle Status:   Active ; Vocabulary:   SNOMED CT        Shortness of breath (IMO  :82505 )  Name of Problem:   Shortness of breath ; Recorder:   SYSTEM,  SYSTEM; Confirmation:   Confirmed ; Classification:   Patient Stated ; Code:   39767 ; Last Updated:   03/28/2020 13:42 EST ; Life Cycle Date:   03/28/2020 ; Life Cycle Status:   Active ; Vocabulary:   IMO          Diagnoses(Active)    Asthma exacerbation  Date:   11/13/2020 ; Diagnosis Type:   Discharge ; Confirmation:   Confirmed ; Clinical Dx:   Asthma exacerbation ; Classification:   Medical ; Clinical Service:   Non-Specified ; Code:   ICD-10-CM ; Probability:   0 ; Diagnosis Code:   J45.901      Shortness of breath  Date:   11/13/2020 ; Diagnosis Type:   Reason For Visit ; Confirmation:   Complaint of ; Clinical Dx:   Shortness of breath ; Classification:   Medical ; Clinical Service:   Non-Specified ; Code:   PNED ; Probability:   0 ; Diagnosis Code:   [id number]             -    Procedure History   (As Of: 11/13/2020 04:00:50 EDT)     Phoebe Perch Fall Risk Assessment Tool   Hx of falling last 3 months ED Fall :   No   Patient confused or  disoriented ED Fall :   No   Patient intoxicated or sedated ED Fall :   No   Patient impaired gait ED Fall :   No   Use a mobility assistance device ED Fall :   No   Patient altered elimination ED Fall :   No   [facility] ED Fall Score :   0    Cliffton Asters Market researcher - 11/13/2020 4:00 EDT   ED Advance Directive   Advance Directive :   No   Cliffton Asters RN, Reola Mosher - 11/13/2020 4:00 EDT

## 2020-11-13 NOTE — ED Notes (Signed)
 ED Patient Summary       ;       [facility] Emergency Department  [address], [place] [address]  [phone]  Discharge Instructions (Patient)  Name: [name] [name], [name] [name]  DOB: 01/31/2001                   MRN: [id number]                   FIN: [id number]  Reason For Visit: Shortness of breath; ASTHMA COMPLICATIONS  Final Diagnosis: Asthma exacerbation     Visit Date: 11/13/2020 01:38:00  Address: [address] [place] [place] [address]  Phone: [phone]     Emergency Department Providers:         Primary Physician:      NEYSA SPANNER MARIE      [facility] would like to thank you for allowing us  to assist you with your healthcare needs. The following includes patient education materials and information regarding your injury/illness.     Follow-up Instructions:  You were seen today on an emergency basis. Please contact your primary care doctor for a follow up appointment. If you received a referral to a specialist doctor, it is important you follow-up as instructed.    It is important that you call your follow-up doctor to schedule and confirm the location of your next appointment. Your doctor may practice at multiple locations. The office location of your follow-up appointment may be different to the one written on your discharge instructions.    If you do not have a primary care doctor, please call [phone] for help in finding a [facility] Provider. For help in finding a specialist doctor, please call [phone].    If your condition gets worse before your follow-up with your primary care doctor or specialist, please return to the Emergency Department.      Coronavirus 2019 (COVID-19) Reminders:     Patients age 75 - 6, with parental consent, and patients over age 26 can make an appointment for a COVID-19 vaccine. Patients can contact their [facility] doctors' offices to schedule an appointment to receive the COVID-19  vaccine. Patients who do not have a [facility] can call [phone] to schedule vaccination appointments.      Follow Up Appointments:  Primary Care Provider:     Name: PCP,  NONE     Phone:                  With: Address: When:   [facility] [phone] Within 1 week   Comments:   Call to establish a primary care physician if you don not have one       With: Address: When:   Follow up with primary care provider  Within 1 week   Comments:   Return to ED if symptoms worsen              [facility] SERVICES%>          New Medications  Printed Prescriptions  albuterol  (albuterol  CFC free 90 mcg/inh inhalation aerosol) 2 Puffs Inhale (breathe in) every 4 hours as needed shortness of breath/wheezing. may administer 1 or 2 puffs every 4 -6 hours as needed. Refills: 0.  Last Dose:____________________  predniSONE  (predniSONE  20 mg oral tablet) 2 Tabs Oral (given by mouth) every day for 5 Days. Refills: 0.  Last Dose:____________________  Allergy Info: No Known Medication Allergies     Discharge Additional Information          Discharge Patient 11/13/20 3:57:00 EDT      Patient Education Materials:        Asthma, Adult      Asthma is a long-term (chronic) condition that causes recurrent episodes in which the airways become tight and narrow. The airways are the passages that lead from the nose and mouth down into the lungs. Asthma episodes, also called asthma attacks, can cause coughing, wheezing, shortness of breath, and chest pain. The airways can also fill with mucus. During an attack, it can be difficult to breathe. Asthma attacks can range from minor to life threatening.    Asthma cannot be cured, but medicines and lifestyle changes can help control it and treat acute attacks.      What are the causes?    This condition is believed to be caused by inherited (genetic) and environmental factors, but its exact cause is not known.    There are many things that can  bring on an asthma attack or make asthma symptoms worse (triggers). Asthma triggers are different for each person. Common triggers include:   Mold.     Dust.     Cigarette smoke.     Cockroaches.     Things that can cause allergy symptoms (allergens), such as animal dander or pollen from trees or grass.     Air pollutants such as household cleaners, wood smoke, smog, or Therapist, occupational.     Cold air, weather changes, and winds (which increase molds and pollen in the air).     Strong emotional expressions such as crying or laughing hard.     Stress.     Certain medicines (such as aspirin) or types of medicines (such as beta-blockers).     Sulfites in foods and drinks. Foods and drinks that may contain sulfites include dried fruit, potato chips, and sparkling grape juice.     Infections or inflammatory conditions such as the flu, a cold, or inflammation of the nasal membranes (rhinitis).     Gastroesophageal reflux disease (GERD).     Exercise or strenuous activity.        What are the signs or symptoms?    Symptoms of this condition may occur right after asthma is triggered or many hours later. Symptoms include:   Wheezing. This can sound like whistling when you breathe.     Excessive nighttime or early morning coughing.     Frequent or severe coughing with a common cold.     Chest tightness.     Shortness of breath.     Tiredness (fatigue) with minimal activity.        How is this diagnosed?    This condition is diagnosed based on:   Your medical history.     A physical exam.     Tests, which may include:  ? Lung function studies and pulmonary studies (spirometry). These tests can evaluate the flow of air in your lungs.    ? Allergy tests.    ? Imaging tests, such as X-rays.          How is this treated?    There is no cure for this condition, but treatment can help control your symptoms. Treatment for asthma usually involves:   Identifying and avoiding your asthma triggers.     Using medicines to control your  symptoms. Generally, two types of medicines are  used to treat asthma:  ? Controller medicines. These help prevent asthma symptoms from occurring. They are usually taken every day.    ? Fast-acting reliever or rescue medicines. These quickly relieve asthma symptoms by widening the narrow and tight airways. They are used as needed and provide short-term relief.       Using supplemental oxygen. This may be needed during a severe episode.     Using other medicines, such as:  ? Allergy medicines, such as antihistamines, if your asthma attacks are triggered by allergens.    ? Immune medicines (immunomodulators). These are medicines that help control the immune system.       Creating an asthma action plan. An asthma action plan is a written plan for managing and treating your asthma attacks. This plan includes:  ? A list of your asthma triggers and how to avoid them.    ? Information about when medicines should be taken and when their dosage should be changed.    ? Instructions about using a device called a peak flow meter. A peak flow meter measures how well the lungs are working and the severity of your asthma. It helps you monitor your condition.          Follow these instructions at home:    Controlling your home environment    Control your home environment in the following ways to help avoid triggers and prevent asthma attacks:   Change your heating and air conditioning filter regularly.     Limit your use of fireplaces and wood stoves.     Get rid of pests (such as roaches and mice) and their droppings.     Throw away plants if you see mold on them.     Clean floors and dust surfaces regularly. Use unscented cleaning products.     Try to have someone else vacuum for you regularly. Stay out of rooms while they are being vacuumed and for a short while afterward. If you vacuum, use a dust mask from a hardware store, a double-layered or microfilter vacuum cleaner bag, or a vacuum cleaner with a HEPA filter.     Replace  carpet with wood, tile, or vinyl flooring. Carpet can trap dander and dust.     Use allergy-proof pillows, mattress covers, and box spring covers.     Keep your bedroom a trigger-free room.      Avoid pets and keep windows closed when allergens are in the air.     Wash beddings every week in hot water and dry them in a dryer.     Use blankets that are made of polyester or cotton.     Clean bathrooms and kitchens with bleach. If possible, have someone repaint the walls in these rooms with mold-resistant paint. Stay out of the rooms that are being cleaned and painted.     Wash your hands often with soap and water. If soap and water are not available, use hand sanitizer.     Do not allow anyone to smoke in your home.        General instructions     Take over-the-counter and prescription medicines only as told by your health care provider.  ? Speak with your health care provider if you have questions about how or when to take the medicines.    ? Make note if you are requiring more frequent dosages.       Do not use any products that contain nicotine or tobacco, such as cigarettes  and e-cigarettes. If you need help quitting, ask your health care provider. Also, avoid being exposed to secondhand smoke.     Use a peak flow meter as told by your health care provider. Record and keep track of the readings.     Understand and use the asthma action plan to help minimize, or stop an asthma attack, without needing to seek medical care.     Make sure you stay up to date on your yearly vaccinations as told by your health care provider. This may include vaccines for the flu and pneumonia.     Avoid outdoor activities when allergen counts are high and when air quality is low.     Wear a ski mask that covers your nose and mouth during outdoor winter activities. Exercise indoors on cold days if you can.     Warm up before exercising, and take time for a cool-down period after exercise.     Keep all follow-up visits as told by your  health care provider. This is important.        Where to find more information     For information about asthma, turn to the Centers for Disease Control and Prevention at http://www.mills-berg.com/.htm     For air quality information, turn to [employer] at Gym[employer].no      Contact a health care provider if:     You have wheezing, shortness of breath, or a cough even while you are taking medicine to prevent attacks.     The mucus you cough up (sputum) is thicker than usual.     Your sputum changes from clear or white to yellow, green, gray, or bloody.     Your medicines are causing side effects, such as a rash, itching, swelling, or trouble breathing.     You need to use a reliever medicine more than 2?3 times a week.     Your peak flow reading is still at 50?79% of your personal best after following your action plan for 1 hour.     You have a fever.      Get help right away if:     You are getting worse and do not respond to treatment during an asthma attack.     You are short of breath when at rest or when doing very little physical activity.     You have difficulty eating, drinking, or talking.     You have chest pain or tightness.     You develop a fast heartbeat or palpitations.     You have a bluish color to your lips or fingernails.     You are light-headed or dizzy, or you faint.     Your peak flow reading is less than 50% of your personal best.     You feel too tired to breathe normally.      Summary     Asthma is a long-term (chronic) condition that causes recurrent episodes in which the airways become tight and narrow. These episodes can cause coughing, wheezing, shortness of breath, and chest pain.     Asthma cannot be cured, but medicines and lifestyle changes can help control it and treat acute attacks.     Make sure you understand how to avoid triggers and how and when to use your medicines.     Asthma attacks can range from minor to life threatening. Get help right away if you have an asthma attack  and do not respond to treatment with your usual  rescue medicines.      This information is not intended to replace advice given to you by your health care provider. Make sure you discuss any questions you have with your health care provider.      Document Revised: 05/26/2019 Document Reviewed: 05/26/2019  Elsevier Patient Education ? 2021 Elsevier Inc.      ---------------------------------------------------------------------------------------------------------------------  [facility] allows patients to review your COVID and other test results as well as discharge documents from any [facility], Emergency Department, surgical center or outpatient lab. Test results are typically available 36 hours after the test is completed.     [facility] encourages you to self-enroll in the [facility] Patient Portal.     To begin your self-enrollment process, please visit [email or web address] [facility], click on "Sign up now".     NOTE: You must be 16 years and older to use [facility] Self-Enroll online. If you are a parent, caregiver, or guardian; you need an invite to access your child's or dependent's health records. To obtain an invite, contact the Medical Records department at [phone] Monday through Friday, 8-4:30, select option 3 . If we receive your call afterhours, we will return your call the next business day.     If you have issues trying to create or access your account, contact [employer] support at [phone] available 7 days a week 24 hours a day.     Comment:

## 2020-12-05 DIAGNOSIS — Z419 Encounter for procedure for purposes other than remedying health state, unspecified: Secondary | ICD-10-CM | POA: Diagnosis not present

## 2021-01-04 DIAGNOSIS — Z419 Encounter for procedure for purposes other than remedying health state, unspecified: Secondary | ICD-10-CM | POA: Diagnosis not present

## 2021-02-04 DIAGNOSIS — Z419 Encounter for procedure for purposes other than remedying health state, unspecified: Secondary | ICD-10-CM | POA: Diagnosis not present

## 2021-03-07 DIAGNOSIS — Z419 Encounter for procedure for purposes other than remedying health state, unspecified: Secondary | ICD-10-CM | POA: Diagnosis not present

## 2021-04-04 DIAGNOSIS — Z419 Encounter for procedure for purposes other than remedying health state, unspecified: Secondary | ICD-10-CM | POA: Diagnosis not present

## 2021-05-05 DIAGNOSIS — Z419 Encounter for procedure for purposes other than remedying health state, unspecified: Secondary | ICD-10-CM | POA: Diagnosis not present

## 2021-05-15 DIAGNOSIS — J45901 Unspecified asthma with (acute) exacerbation: Secondary | ICD-10-CM

## 2021-05-15 NOTE — ED Provider Notes (Signed)
[facility] EMERGENCY DEPT  EMERGENCY DEPARTMENT ENCOUNTER      Pt Name: [name] [name]  MRN: [id number]  Birthdate 2000/10/10  Date of evaluation: 05/15/2021  Provider: Valentina Shaggy, PA-C    CHIEF COMPLAINT       Chief Complaint   Patient presents with    Wheezing     Pt complains of trouble breathing that started tonight when she was putting her baby to bed. Inspiratory wheeze audible in triage. Hx of asthma. States inhaler has been out for almost a week.          HISTORY OF PRESENT ILLNESS    Wheezing with h/o Asthma and out of Albuterol inhaler. Wheezing begin suddenly when putting son down for sleep. She admits to chest tightness, shortness of breath and wheezing. Patient has been out of her "blue inhaler" and albuterol inhaler x2 weeks.     The history is provided by the patient.     Nursing Notes were reviewed.    REVIEW OF SYSTEMS     Review of Systems   Constitutional: Negative.    Respiratory:  Positive for shortness of breath and wheezing.    Cardiovascular: Negative.  Negative for chest pain.   Allergic/Immunologic: Negative for environmental allergies and food allergies.   All other systems reviewed and are negative.    Except as noted above the remainder of the review of systems was reviewed and negative.     PAST MEDICAL HISTORY   No past medical history on file.    SURGICAL HISTORY     No past surgical history on file.    CURRENT MEDICATIONS       There are no discharge medications for this patient.      ALLERGIES     Other and Watermelon flavor    FAMILY HISTORY     No family history on file.     SOCIAL HISTORY       Social History     Socioeconomic History    Marital status: Single       SCREENINGS         Glasgow Coma Scale  Eye Opening: Spontaneous  Best Verbal Response: Oriented  Best Motor Response: Obeys commands  Glasgow Coma Scale Score: 15                     CIWA Assessment  BP: 115/66  Heart Rate: 76                 PHYSICAL EXAM       ED Triage Vitals [05/15/21 2345]   BP Temp Temp Source  Heart Rate Resp SpO2 Height Weight   133/88 98.6 F (37 C) Oral 67 18 99 % -- 149 lb (67.6 kg)       Physical Exam  Vitals and nursing note reviewed.   Constitutional:       Appearance: Normal appearance.   HENT:      Head: Normocephalic and atraumatic.   Cardiovascular:      Rate and Rhythm: Normal rate and regular rhythm.   Pulmonary:      Comments: Tachypnea, moderate expiratory wheezing R>L anterior lungs and posterior lungs clear bilaterally; 99% RA  Skin:     General: Skin is warm and dry.   Neurological:      General: No focal deficit present.      Mental Status: She is alert.       Procedures    DIAGNOSTIC RESULTS  EKG: All EKG's are interpreted by the Emergency Department Physician who either signs or Co-signs this chart in the absence of a cardiologist.    RADIOLOGY:   No orders to display       LABS:  Labs Reviewed - No data to display    All other labs were within normal range or not returned as of this dictation.    EMERGENCY DEPARTMENT COURSE/REASSESSMENT and MDM:   MDM  Number of Diagnoses or Management Options  Mild asthma with acute exacerbation, unspecified whether persistent  Diagnosis management comments: Patient improved greatly with 1 Duo Neb treatment and discharged with Albuterol HFA inhaler. Patient stable for discharge and to establish a PMD as referred. Given precautions in which to return. She is not hypoxic and stable for discharge.    Risk of Complications, Morbidity, and/or Mortality  Presenting problems: low  Diagnostic procedures: low  Management options: low    Patient Progress  Patient progress: improved      ED Course as of 05/16/21 0325   Wed May 16, 2021   0146 Wheezing resolved after DuoNeb [JK]      ED Course User Index  [JK] Valentina Shaggy, PA-C       FINAL IMPRESSION      1. Mild asthma with acute exacerbation, unspecified whether persistent          DISPOSITION/PLAN   DISPOSITION Decision To Discharge 05/16/2021 01:48:02 AM      PATIENT REFERRED TO:  [facility] [place]  [address]  [address]  [place] [address]  In 1 week  For futher evaluation/treatment; to establish a family doctor      DISCHARGE MEDICATIONS:  There are no discharge medications for this patient.    Controlled Substances Monitoring:     No flowsheet data found.    (Please note that portions of this note were completed with a voice recognition program.  Efforts were made to edit the dictations but occasionally words are mis-transcribed.)    Valentina Shaggy, PA-C (electronically signed)  Emergency Medicine          Valentina Shaggy, PA-C  05/16/21 [phone]

## 2021-05-16 ENCOUNTER — Inpatient Hospital Stay: Admit: 2021-05-16 | Discharge: 2021-05-16 | Disposition: A | Discharge status: Home / Self Care | Payer: MEDICAID

## 2021-05-16 MED ORDER — IPRATROPIUM-ALBUTEROL 0.5-2.5 (3) MG/3ML IN SOLN
Freq: Once | RESPIRATORY_TRACT | Status: AC
Start: 2021-05-16 — End: 2021-05-16
  Administered 2021-05-16: 04:00:00 1 via RESPIRATORY_TRACT

## 2021-05-16 MED ORDER — ALBUTEROL SULFATE HFA 108 (90 BASE) MCG/ACT IN AERS
108 (90 Base) MCG/ACT | Freq: Once | RESPIRATORY_TRACT | Status: AC
Start: 2021-05-16 — End: 2021-05-16
  Administered 2021-05-16: 06:00:00 2 via RESPIRATORY_TRACT

## 2021-05-16 MED FILL — VENTOLIN HFA 108 (90 BASE) MCG/ACT IN AERS: 108 (90 Base) MCG/ACT | RESPIRATORY_TRACT | Qty: 8

## 2021-05-16 MED FILL — IPRATROPIUM-ALBUTEROL 0.5-2.5 (3) MG/3ML IN SOLN: RESPIRATORY_TRACT | Qty: 3

## 2021-05-16 NOTE — Discharge Instructions (Signed)
Please use your Albuterol "rescue" inhaler 2 puffs every 6 hours as needed for wheezing or shortness of breath. Return to the ER for worsening condition or if continued wheezing despite using inhaler.

## 2021-05-16 NOTE — ED Notes (Signed)
Pt dc'd by EDMD, VSS, pt given dc papers, prescriptions and verbalized understanding of instructions, pt ambulated steady gait out of the ED      Dell P[place], RN  05/16/21 0158

## 2021-05-30 ENCOUNTER — Inpatient Hospital Stay
Admit: 2021-05-30 | Discharge: 2021-05-30 | Disposition: A | Discharge status: Home / Self Care | Payer: MEDICAID | Attending: Emergency Medicine

## 2021-05-30 DIAGNOSIS — J45901 Unspecified asthma with (acute) exacerbation: Secondary | ICD-10-CM

## 2021-05-30 MED ORDER — ALBUTEROL SULFATE (2.5 MG/3ML) 0.083% IN NEBU
Freq: Once | RESPIRATORY_TRACT | Status: AC
Start: 2021-05-30 — End: 2021-05-30
  Administered 2021-05-30: 09:00:00 15 mg/h via RESPIRATORY_TRACT

## 2021-05-30 MED ORDER — PREDNISONE 20 MG PO TABS
20 MG | Freq: Once | ORAL | Status: AC
Start: 2021-05-30 — End: 2021-05-30
  Administered 2021-05-30: 09:00:00 60 mg via ORAL

## 2021-05-30 MED ORDER — PREDNISONE 20 MG PO TABS
20 MG | ORAL_TABLET | Freq: Every day | ORAL | 0 refills | Status: AC
Start: 2021-05-30 — End: 2021-06-04

## 2021-05-30 MED ORDER — ALBUTEROL SULFATE 2.5 MG/0.5ML IN NEBU
2.5 MG/0.5ML | Freq: Four times a day (QID) | RESPIRATORY_TRACT | 2 refills | Status: AC | PRN
Start: 2021-05-30 — End: 2022-07-23

## 2021-05-30 MED ORDER — ALBUTEROL SULFATE HFA 108 (90 BASE) MCG/ACT IN AERS
108 (90 Base) MCG/ACT | Freq: Four times a day (QID) | RESPIRATORY_TRACT | 3 refills | Status: AC | PRN
Start: 2021-05-30 — End: 2022-07-23

## 2021-05-30 MED ORDER — IPRATROPIUM BROMIDE 0.02 % IN SOLN
0.02 % | Freq: Once | RESPIRATORY_TRACT | Status: AC
Start: 2021-05-30 — End: 2021-05-30
  Administered 2021-05-30: 09:00:00 0.5 mg via RESPIRATORY_TRACT

## 2021-05-30 MED FILL — IPRATROPIUM BROMIDE 0.02 % IN SOLN: 0.02 % | RESPIRATORY_TRACT | Qty: 2.5

## 2021-05-30 MED FILL — ALBUTEROL SULFATE (2.5 MG/3ML) 0.083% IN NEBU: RESPIRATORY_TRACT | Qty: 18

## 2021-05-30 MED FILL — PREDNISONE 20 MG PO TABS: 20 MG | ORAL | Qty: 3

## 2021-05-30 NOTE — ED Notes (Addendum)
Patients respirations are clear, unlabored, and at normal speed at this time. Neb treatment finished at this time.     [place], RN  05/30/21 [phone]

## 2021-05-30 NOTE — ED Provider Notes (Signed)
[facility] EMERGENCY DEPT  EMERGENCY DEPARTMENT ENCOUNTER      Pt Name: [name] [name]  MRN: [id number]  Birthdate 2000-02-07  Date of evaluation: 05/30/2021  Provider: Caroleen Hamman, MD    CHIEF COMPLAINT       Chief Complaint   Patient presents with    Shortness of Breath     Arrives SOB with hx asthma, states was trying to use albuterol inhaler at home without relief. Walking with ease, resp labored with audible wheezes easily heard without stethoscope. Denies any other medical hx.          HISTORY OF PRESENT ILLNESS    Asthma exacerbation.  21 year old female with a history of severe asthma exacerbations.    Increasing shortness of breath tonight.  Described as typical for asthma.  Oxygen saturations 89% room air upon arrival.  Has been trying to use her albuterol without success.  Does not smoke but lives with smokers.  No other significant medical history.    The history is provided by the patient and medical records.     Nursing Notes were reviewed.    REVIEW OF SYSTEMS     Review of Systems   Constitutional:  Negative for fever.   Respiratory:  Positive for shortness of breath and wheezing.    Cardiovascular:  Negative for chest pain and palpitations.   Gastrointestinal:  Negative for abdominal pain, nausea and vomiting.   Genitourinary:  Negative for dysuria.   Musculoskeletal:  Negative for back pain.   Skin:  Negative for rash.   Neurological:  Negative for dizziness, weakness and headaches.   All other systems reviewed and are negative.    Except as noted above the remainder of the review of systems was reviewed and negative.     PAST MEDICAL HISTORY   No past medical history on file.    SURGICAL HISTORY     No past surgical history on file.    CURRENT MEDICATIONS       Discharge Medication List as of 05/30/2021  6:18 AM          ALLERGIES     Other and Watermelon flavor    FAMILY HISTORY     No family history on file.     SOCIAL HISTORY       Social History     Socioeconomic History    Marital status:  Single       SCREENINGS         Glasgow Coma Scale  Eye Opening: Spontaneous  Best Verbal Response: Oriented  Best Motor Response: Obeys commands  Glasgow Coma Scale Score: 15                     CIWA Assessment  BP: 125/75  Heart Rate: (!) 101                 PHYSICAL EXAM       ED Triage Vitals [05/30/21 0504]   BP Temp Temp Source Heart Rate Resp SpO2 Height Weight   103/73 98.5 F (36.9 C) Oral (!) 101 26 (!) 87 % 5\' 4"  (1.626 m) 145 lb (65.8 kg)       Physical Exam  Vitals and nursing note reviewed.   Constitutional:       General: She is in acute distress.      Appearance: She is ill-appearing.   HENT:      Head: Normocephalic and atraumatic.      Mouth/Throat:  Mouth: Mucous membranes are moist.      Comments: Airway patent  Eyes:      Pupils: Pupils are equal, round, and reactive to light.   Neck:      Vascular: No JVD.   Cardiovascular:      Rate and Rhythm: Normal rate and regular rhythm.   Pulmonary:      Effort: Tachypnea and accessory muscle usage present. No respiratory distress.      Breath sounds: Examination of the right-upper field reveals wheezing. Examination of the left-upper field reveals wheezing. Examination of the right-middle field reveals wheezing. Examination of the left-middle field reveals wheezing. Examination of the right-lower field reveals wheezing. Examination of the left-lower field reveals wheezing. Decreased breath sounds and wheezing present. No rhonchi.      Comments: Tripoding position.  Speaking in phrases  Abdominal:      Palpations: Abdomen is soft.      Tenderness: There is no abdominal tenderness.   Musculoskeletal:      Cervical back: Neck supple.      Comments: Neurovascular intact distally   Skin:     General: Skin is warm and dry.   Neurological:      General: No focal deficit present.      Mental Status: She is alert and oriented to person, place, and time. Mental status is at baseline.   Psychiatric:         Mood and Affect: Mood normal.         Behavior:  Behavior normal.       Procedures    DIAGNOSTIC RESULTS     EKG: All EKG's are interpreted by the Emergency Department Physician who either signs or Co-signs this chart in the absence of a cardiologist.    RADIOLOGY:   No orders to display       LABS:  Labs Reviewed - No data to display    All other labs were within normal range or not returned as of this dictation.    EMERGENCY DEPARTMENT COURSE/REASSESSMENT and MDM:   MDM      ED Course as of 05/31/21 0449   Wed May 30, 2021   0542 Reevaluated 5:35 AM.  Started to feel better.  Receiving 15 mg nebulized albuterol. [RG]   4098 Repeat evaluation 6:15 AM.  Patient comfortable.  Will discharge after nebulizer if sats are 92% or greater on room air. [RG]      ED Course User Index  [RG] Caroleen Hamman, MD       FINAL IMPRESSION      1. Severe asthma with exacerbation, unspecified whether persistent          DISPOSITION/PLAN   DISPOSITION Decision To Discharge 05/30/2021 06:17:22 AM      PATIENT REFERRED TO:  Locate a Doc  Call [phone] DOCS [phone])  Call         DISCHARGE MEDICATIONS:  Discharge Medication List as of 05/30/2021  6:18 AM        START taking these medications    Details   albuterol sulfate HFA (PROVENTIL HFA) 108 (90 Base) MCG/ACT inhaler Inhale 2 puffs into the lungs every 6 hours as needed for Wheezing, Disp-18 g, R-3Print      albuterol (PROVENTIL) 2.5 MG/0.5ML NEBU nebulizer solution Take 0.5 mLs by nebulization every 6 hours as needed for Wheezing, Disp-20 mL, R-2Print      predniSONE (DELTASONE) 20 MG tablet Take 2 tablets by mouth daily for 4 days, Disp-8 tablet, R-0Print  Controlled Substances Monitoring:     No flowsheet data found.    (Please note that portions of this note were completed with a voice recognition program.  Efforts were made to edit the dictations but occasionally words are mis-transcribed.)    Caroleen Hamman, MD (electronically signed)  Emergency Medicine            Caroleen Hamman, MD  05/31/21 [phone]

## 2021-06-04 DIAGNOSIS — Z419 Encounter for procedure for purposes other than remedying health state, unspecified: Secondary | ICD-10-CM | POA: Diagnosis not present

## 2021-06-14 IMAGING — US US MFM OB DETAIL+14 WK
1 series · 13 of 28 positions shown · non-contrast
Comparison: none

[Series 1: us mfm ob detail+14 wk · 13 of 64 slices shown]
[im 3/64]
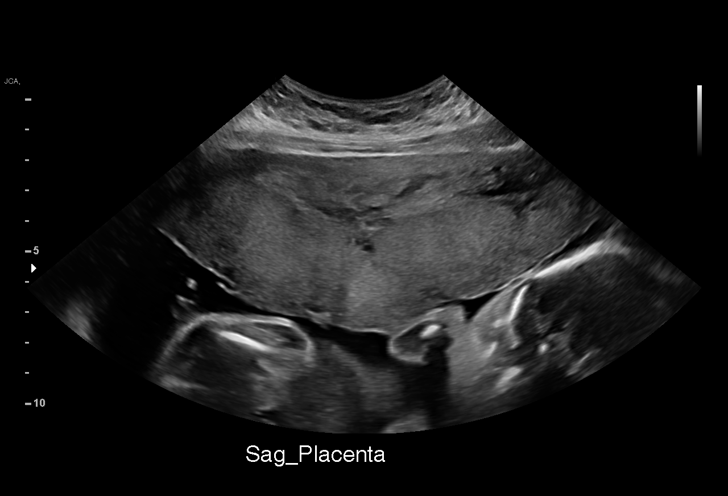
[im 8/64]
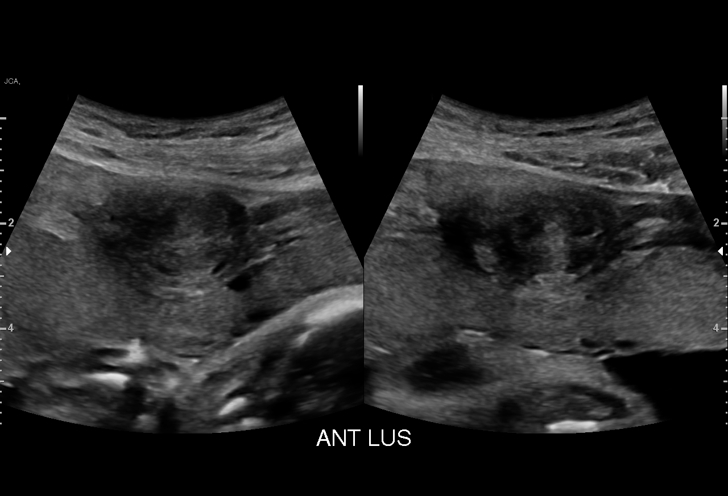
[im 12/64]
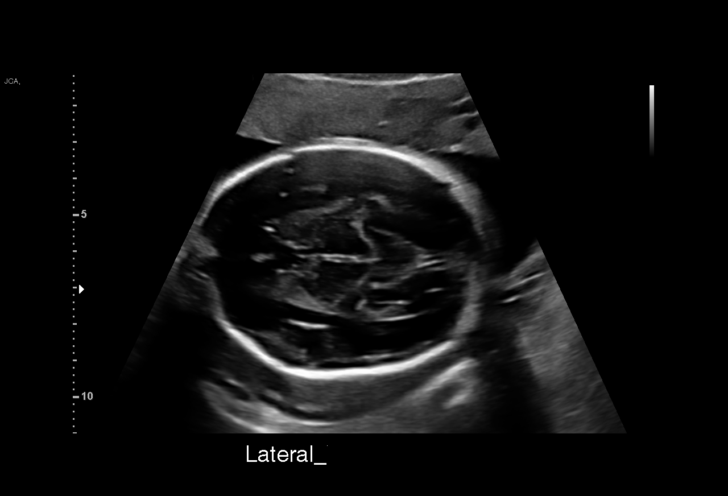
[im 17/64]
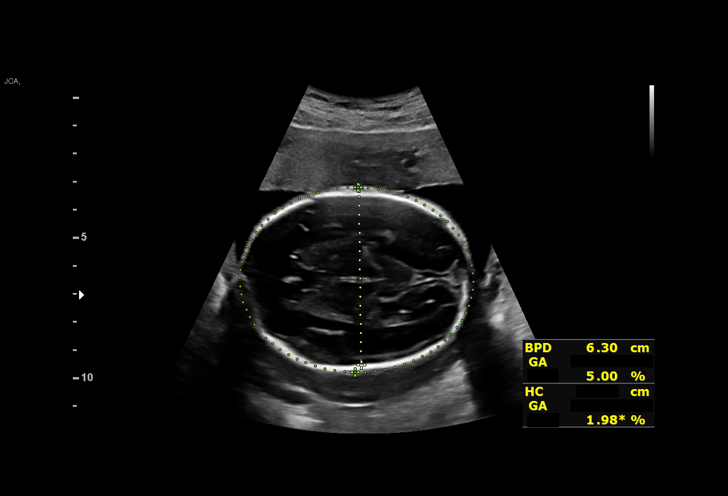
[im 22/64]
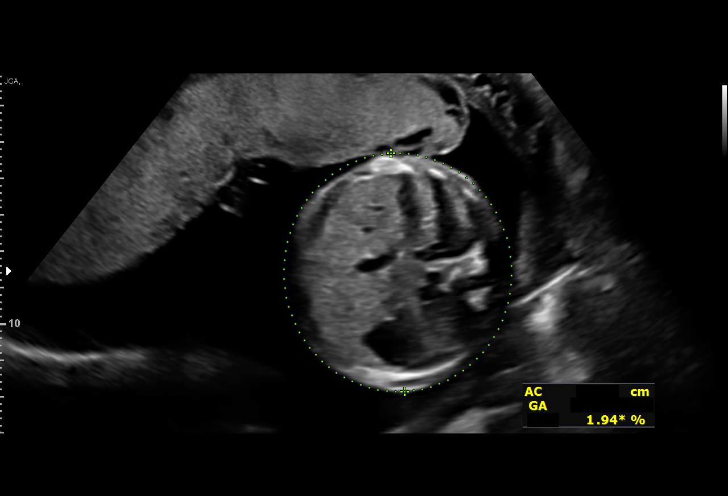
[im 26/64]
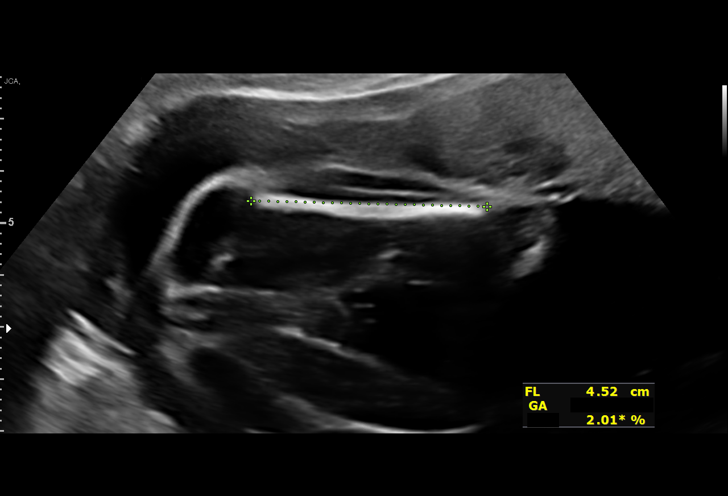
[im 33/64]
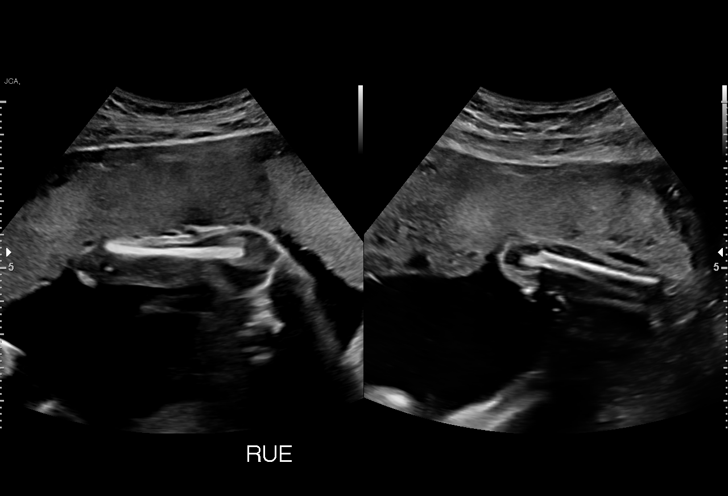
[im 38/64]
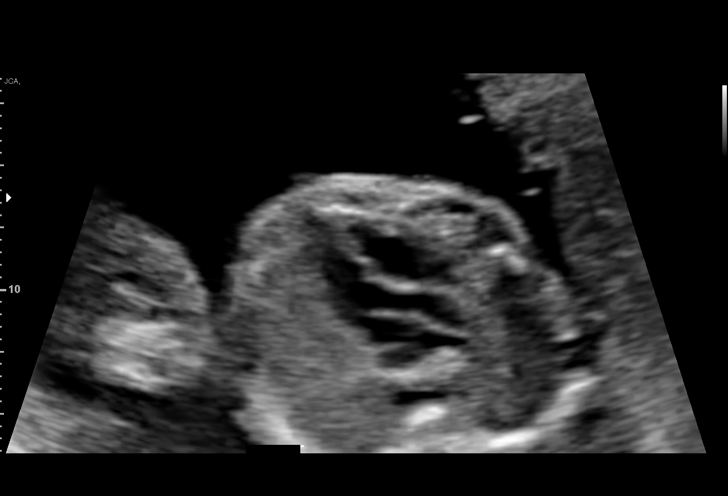
[im 43/64]
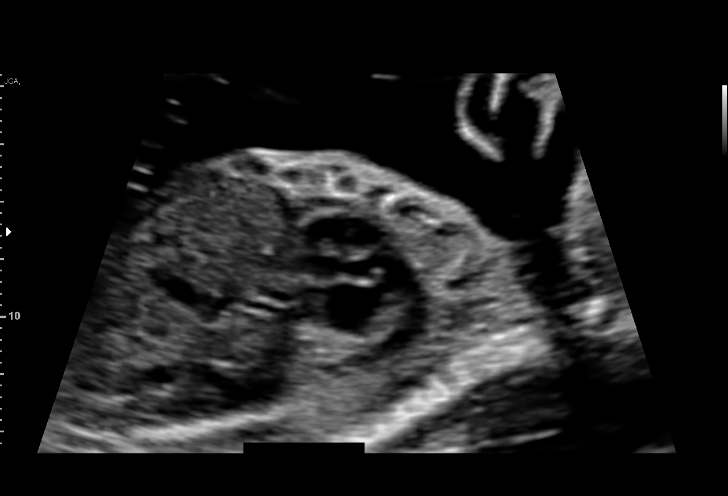
[im 47/64]
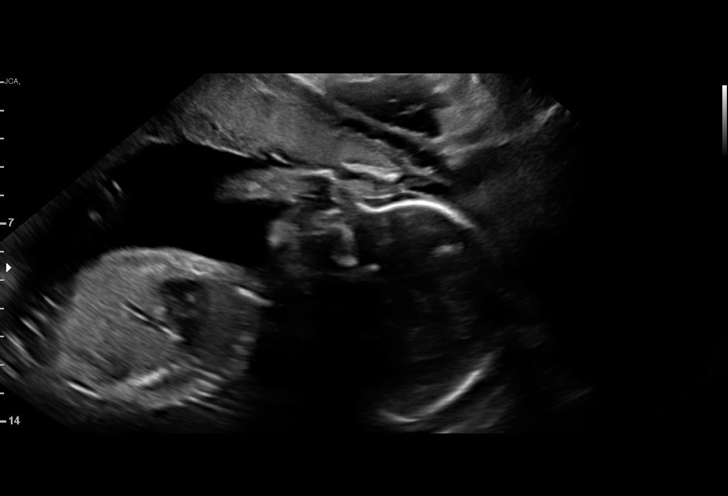
[im 52/64]
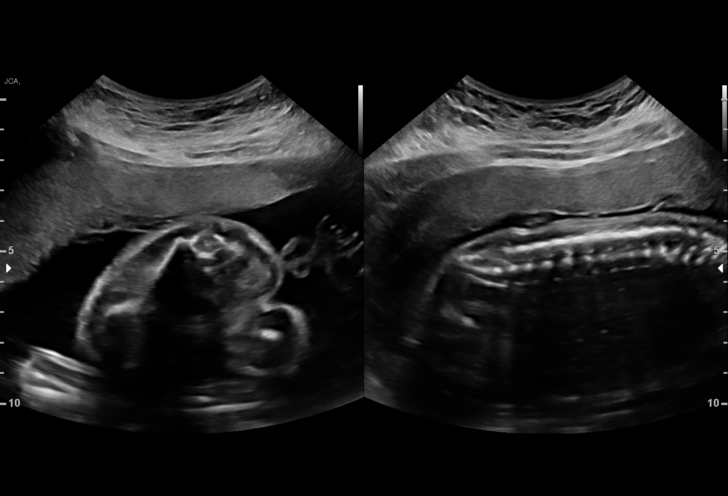
[im 57/64]
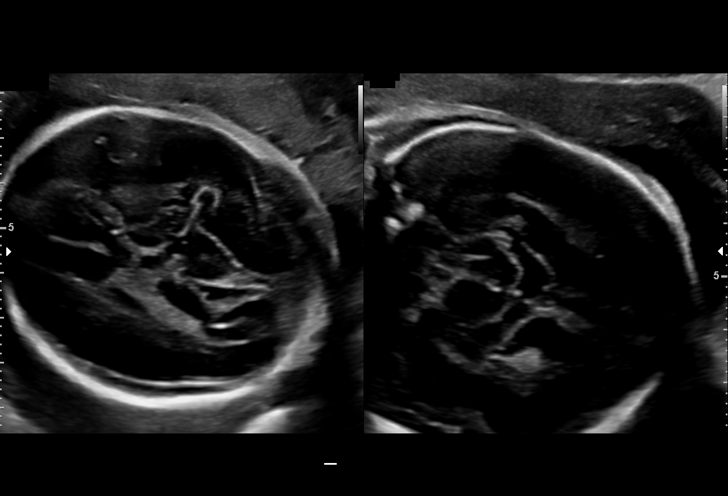
[im 61/64]
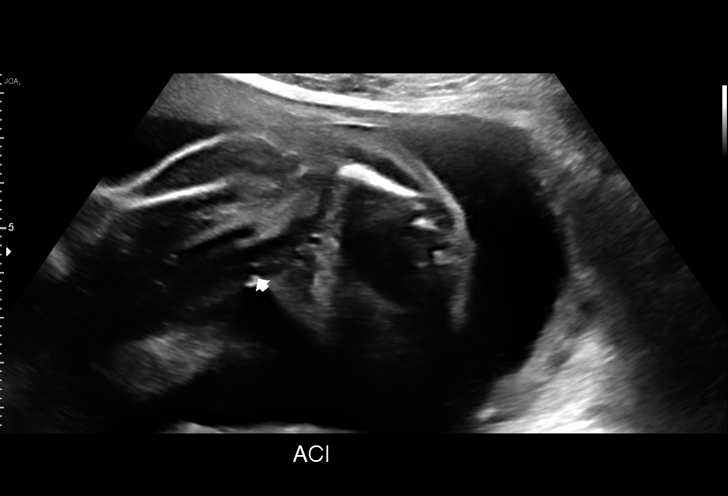

[13 of 28 positions shown; findings below may reference images not displayed]

Indications

 25 weeks gestation of pregnancy
 Encounter for antenatal screening for
 malformations
 Late prenatal care, second trimester
 Uterine fibroids affecting pregnancy in        O34.12,
 second trimester, antepartum
Fetal Evaluation

 Num Of Fetuses:         1
 Fetal Heart Rate(bpm):  150
 Cardiac Activity:       Observed
 Presentation:           Cephalic
 Placenta:               Anterior
 P. Cord Insertion:      Visualized

 Amniotic Fluid
 AFI FV:      Within normal limits

                             Largest Pocket(cm)

Biometry

 BPD:      62.6  mm     G. Age:  25w 3d         45  %    CI:         72.1   %    70 - 86
                                                         FL/HC:      19.4   %    18.7 -
 HC:      234.6  mm     G. Age:  25w 4d         35  %    HC/AC:      1.15        1.04 -
 AC:      204.8  mm     G. Age:  25w 0d         34  %    FL/BPD:     72.7   %    71 - 87
 FL:       45.5  mm     G. Age:  25w 0d         29  %    FL/AC:      22.2   %    20 - 24
 HUM:      42.7  mm     G. Age:  25w 4d         51  %
 LV:        2.3  mm

 Est. FW:     773  gm    1 lb 11 oz      33  %
OB History

 Gravidity:    1         Term:   0
 Living:       0
Gestational Age

 LMP:           27w 0d        Date:  04/27/19                 EDD:   02/01/20
 U/S Today:     25w 2d                                        EDD:   02/13/20
 Best:          25w 2d     Det. By:  U/S (11/02/19)           EDD:   02/13/20
Anatomy

 Cranium:               Appears normal         Aortic Arch:            Appears normal
 Cavum:                 Appears normal         Ductal Arch:            Appears normal
 Ventricles:            Appears normal         Diaphragm:              Appears normal
 Choroid Plexus:        Appears normal         Stomach:                Appears normal, left
                                                                       sided
 Cerebellum:            Appears normal         Abdomen:                Appears normal
 Posterior Fossa:       Appears normal         Abdominal Wall:         Appears nml (cord
                                                                       insert, abd wall)
 Nuchal Fold:           Not applicable (>20    Cord Vessels:           Appears normal (3
                        wks GA)                                        vessel cord)
 Face:                  Appears normal         Kidneys:                Appear normal
                        (orbits and profile)
 Lips:                  Previously seen        Bladder:                Appears normal
 Thoracic:              Appears normal         Spine:                  Appears normal
 Heart:                 Appears normal         Upper Extremities:      Appears normal
                        (4CH, axis, and
                        situs)
 RVOT:                  Not well visualized    Lower Extremities:      Appears normal
 LVOT:                  Appears normal

 Other:  Normal genaitalia. Fetus appears to be a male. Technically difficult
         due to fetal position.
Myomas

 Site                     L(cm)      W(cm)      D(cm)       Location
 Anterior                 3.01       2.12       3.4         Intramural

 Blood Flow                  RI       PI       Comments

Impression

 Single intrauterine pregnancy here for a detailed anatomy
 Normal anatomy with measurements inconsistent with dates,
 therefore we have changed the dates to today's ultrasound.
 There is good fetal movement and amniotic fluid volume
 Suboptimal views of the fetal anatomy were obtained
 secondary to fetal position.

 One fibroid of 3 x 3 x 2 cm was observed. I discussed the
 increased risk for fibroid digeneration and
 NIPS performed by pending.
Recommendations

 Follow up growth in 4 weeks.

## 2021-07-05 DIAGNOSIS — Z419 Encounter for procedure for purposes other than remedying health state, unspecified: Secondary | ICD-10-CM | POA: Diagnosis not present

## 2021-07-14 ENCOUNTER — Emergency Department: Admit: 2021-07-14 | Payer: MEDICAID

## 2021-07-14 ENCOUNTER — Inpatient Hospital Stay: Admit: 2021-07-14 | Discharge: 2021-07-14 | Disposition: A | Discharge status: Home / Self Care | Payer: MEDICAID

## 2021-07-14 DIAGNOSIS — J45901 Unspecified asthma with (acute) exacerbation: Secondary | ICD-10-CM

## 2021-07-14 MED ORDER — PREDNISONE 20 MG PO TABS
20 MG | Freq: Once | ORAL | Status: AC
Start: 2021-07-14 — End: 2021-07-14
  Administered 2021-07-14: 07:00:00 40 mg via ORAL

## 2021-07-14 MED ORDER — ALBUTEROL SULFATE HFA 108 (90 BASE) MCG/ACT IN AERS
108 (90 Base) MCG/ACT | Freq: Four times a day (QID) | RESPIRATORY_TRACT | 0 refills | Status: AC | PRN
Start: 2021-07-14 — End: 2021-07-21

## 2021-07-14 MED ORDER — PREDNISONE 20 MG PO TABS
20 MG | ORAL_TABLET | Freq: Every day | ORAL | 0 refills | Status: AC
Start: 2021-07-14 — End: 2021-07-18

## 2021-07-14 MED ORDER — IPRATROPIUM BROMIDE 0.02 % IN SOLN
0.02 % | Freq: Once | RESPIRATORY_TRACT | Status: AC
Start: 2021-07-14 — End: 2021-07-14
  Administered 2021-07-14: 07:00:00 0.5 mg via RESPIRATORY_TRACT

## 2021-07-14 MED ORDER — ALBUTEROL SULFATE (2.5 MG/3ML) 0.083% IN NEBU
Freq: Once | RESPIRATORY_TRACT | Status: AC
Start: 2021-07-14 — End: 2021-07-14
  Administered 2021-07-14: 07:00:00 5 mg via RESPIRATORY_TRACT

## 2021-07-14 MED FILL — ALBUTEROL SULFATE (2.5 MG/3ML) 0.083% IN NEBU: RESPIRATORY_TRACT | Qty: 6

## 2021-07-14 MED FILL — PREDNISONE 20 MG PO TABS: 20 MG | ORAL | Qty: 2

## 2021-07-14 MED FILL — IPRATROPIUM BROMIDE 0.02 % IN SOLN: 0.02 % | RESPIRATORY_TRACT | Qty: 2.5

## 2021-07-14 NOTE — ED Provider Notes (Signed)
[facility] EMERGENCY DEPT  EMERGENCY DEPARTMENT ENCOUNTER      Pt Name: [name] [name]  MRN: [id number]  Birthdate 06-06-2000  Date of evaluation: 07/14/2021  Provider: Vanice Sarah, PA-C    CHIEF COMPLAINT       Chief Complaint   Patient presents with    Shortness of Breath     STATES SOB STARTED 2 HOURS AGO--NO BETTER AFTER HOME MEDS--NO FEVERS--OR OTHER S/S         HISTORY OF PRESENT ILLNESS   (Location/Symptom, Timing/Onset, Context/Setting, Quality, Duration, Modifying Factors, Severity)  Note limiting factors.   [name] [name] is a 21 y.o. female who presents to the emergency department      22 y/o AAF with asthma exacerbation.  States waking up this AM with wheezing and SOB.  She attempted to use her inhaler, "but I ran out."  Denies fever.      The history is provided by the patient.     Nursing Notes were reviewed.    REVIEW OF SYSTEMS    (2-9 systems for level 4, 10 or more for level 5)     Review of Systems   Constitutional: Negative.    HENT: Negative.     Respiratory:  Positive for shortness of breath and wheezing.    Cardiovascular: Negative.    Gastrointestinal: Negative.    Musculoskeletal: Negative.    Skin: Negative.    Neurological: Negative.    Psychiatric/Behavioral: Negative.       Except as noted above the remainder of the review of systems was reviewed and negative.       PAST MEDICAL HISTORY   No past medical history on file.      SURGICAL HISTORY     No past surgical history on file.      CURRENT MEDICATIONS       Discharge Medication List as of 07/14/2021  3:53 AM        CONTINUE these medications which have NOT CHANGED    Details   !! albuterol sulfate HFA (PROVENTIL HFA) 108 (90 Base) MCG/ACT inhaler Inhale 2 puffs into the lungs every 6 hours as needed for Wheezing, Disp-18 g, R-3Print      albuterol (PROVENTIL) 2.5 MG/0.5ML NEBU nebulizer solution Take 0.5 mLs by nebulization every 6 hours as needed for Wheezing, Disp-20 mL, R-2Print       !! - Potential duplicate medications found.  Please discuss with provider.          ALLERGIES     Other and Watermelon flavor    FAMILY HISTORY     No family history on file.       SOCIAL HISTORY       Social History     Socioeconomic History    Marital status: Single       SCREENINGS         Glasgow Coma Scale  Eye Opening: Spontaneous  Best Verbal Response: Oriented  Best Motor Response: Obeys commands  Glasgow Coma Scale Score: 15                     CIWA Assessment  BP: (!) 118/56  Pulse: 79                 PHYSICAL EXAM    (up to 7 for level 4, 8 or more for level 5)     ED Triage Vitals [07/14/21 0217]   BP Temp Temp src Pulse Respirations SpO2 Height Weight - Scale   Marland Kitchen)  114/55 98.7 F (37.1 C) -- 78 24 99 % 5\' 4"  (1.626 m) 146 lb (66.2 kg)       Physical Exam  Vitals and nursing note reviewed.   Constitutional:       General: She is not in acute distress.     Appearance: She is well-developed. She is not ill-appearing.   Cardiovascular:      Rate and Rhythm: Normal rate and regular rhythm.   Pulmonary:      Effort: No respiratory distress.      Breath sounds: Decreased breath sounds and wheezing present.   Musculoskeletal:         General: Normal range of motion.   Skin:     General: Skin is warm and dry.   Neurological:      Mental Status: She is alert and oriented to person, place, and time.   Psychiatric:         Mood and Affect: Mood normal.         Behavior: Behavior normal.       DIAGNOSTIC RESULTS     EKG: All EKG's are interpreted by the Emergency Department Physician who either signs or Co-signs this chart in the absence of a cardiologist.        RADIOLOGY:   Non-plain film images such as CT, Ultrasound and MRI are read by the radiologist. Plain radiographic images are visualized and preliminarily interpreted by the emergency physician with the below findings:        Interpretation per the Radiologist below, if available at the time of this note:    XR CHEST PORTABLE   Final Result   No acute disease.               ED BEDSIDE ULTRASOUND:    Performed by ED Physician - none    LABS:  Labs Reviewed - No data to display    All other labs were within normal range or not returned as of this dictation.    EMERGENCY DEPARTMENT COURSE and DIFFERENTIAL DIAGNOSIS/MDM:   Vitals:    Vitals:    07/14/21 0217 07/14/21 0404   BP: (!) 114/55 (!) 118/56   Pulse: 78 79   Resp: 24 18   Temp: 98.7 F (37.1 C)    SpO2: 99% 98%   Weight: 66.2 kg    Height: 5\' 4"  (1.626 m)            Medical Decision Making  Noted improvement with Albuterol/Atrovent neb tx and Prednisone.  We will refill Albuterol inhaler and provide Rx for Albuterol inhaler.    Amount and/or Complexity of Data Reviewed  Radiology: ordered.    Risk  Prescription drug management.          FINAL IMPRESSION      1. Mild asthma with exacerbation, unspecified whether persistent          DISPOSITION/PLAN   DISPOSITION Decision To Discharge 07/14/2021 03:50:37 AM      PATIENT REFERRED TO:  Follow-up with a primary care physician    Schedule an appointment as soon as possible for a visit   See clinic list provided      DISCHARGE MEDICATIONS:  Discharge Medication List as of 07/14/2021  3:53 AM        START taking these medications    Details   predniSONE (DELTASONE) 20 MG tablet Take 2 tablets by mouth daily for 4 days, Disp-8 tablet, R-0Print      !! albuterol sulfate HFA (  VENTOLIN HFA) 108 (90 Base) MCG/ACT inhaler Inhale 2 puffs into the lungs every 6 hours as needed for Wheezing or Shortness of Breath, Disp-54 g, R-0Print       !! - Potential duplicate medications found. Please discuss with provider.        Controlled Substances Monitoring:     No flowsheet data found.    (Please note that portions of this note were completed with a voice recognition program.  Efforts were made to edit the dictations but occasionally words are mis-transcribed.)    Keiley Levey Koleen Nimrod, PA-C (electronically signed)  Attending Emergency Physician           Vanice Sarah, PA-C  07/14/21 1905

## 2021-07-14 NOTE — ED Notes (Signed)
Pt dc'd by EDMD, VSS, pt given dc papers, prescriptions and verbalized understanding of instructions, pt ambulated steady gait out of the ED      Christeen Douglas, RN  07/14/21 0405

## 2021-07-16 NOTE — Telephone Encounter (Signed)
ED Follow up - Patient disconnected

## 2021-07-29 ENCOUNTER — Inpatient Hospital Stay: Admit: 2021-07-29 | Discharge: 2021-07-29 | Disposition: A | Discharge status: Home / Self Care | Payer: MEDICAID

## 2021-07-29 DIAGNOSIS — J45901 Unspecified asthma with (acute) exacerbation: Secondary | ICD-10-CM

## 2021-07-29 MED ORDER — IPRATROPIUM BROMIDE 0.02 % IN SOLN
0.02 % | Freq: Once | RESPIRATORY_TRACT | Status: AC
Start: 2021-07-29 — End: 2021-07-29
  Administered 2021-07-29: 08:00:00 0.5 mg via RESPIRATORY_TRACT

## 2021-07-29 MED ORDER — ALBUTEROL SULFATE 2.5 MG/0.5ML IN NEBU
2.5 MG/0.5ML | RESPIRATORY_TRACT | 0 refills | Status: AC | PRN
Start: 2021-07-29 — End: 2021-08-05

## 2021-07-29 MED ORDER — PREDNISONE 20 MG PO TABS
20 MG | ORAL_TABLET | Freq: Every day | ORAL | 0 refills | Status: AC
Start: 2021-07-29 — End: 2021-08-02

## 2021-07-29 MED ORDER — PREDNISONE 20 MG PO TABS
20 MG | Freq: Once | ORAL | Status: AC
Start: 2021-07-29 — End: 2021-07-29
  Administered 2021-07-29: 08:00:00 40 mg via ORAL

## 2021-07-29 MED ORDER — ALBUTEROL SULFATE (2.5 MG/3ML) 0.083% IN NEBU
Freq: Once | RESPIRATORY_TRACT | Status: AC
Start: 2021-07-29 — End: 2021-07-29
  Administered 2021-07-29: 08:00:00 5 mg via RESPIRATORY_TRACT

## 2021-07-29 MED FILL — IPRATROPIUM BROMIDE 0.02 % IN SOLN: 0.02 % | RESPIRATORY_TRACT | Qty: 2.5

## 2021-07-29 MED FILL — ALBUTEROL SULFATE (2.5 MG/3ML) 0.083% IN NEBU: RESPIRATORY_TRACT | Qty: 6

## 2021-07-29 MED FILL — PREDNISONE 20 MG PO TABS: 20 MG | ORAL | Qty: 2

## 2021-07-29 NOTE — ED Provider Notes (Signed)
[facility] EMERGENCY DEPT  EMERGENCY DEPARTMENT ENCOUNTER      Pt Name: [name] [name]  MRN: [id number]  Birthdate 2000/03/29  Date of evaluation: 07/29/2021  Provider: Vanice Sarah, PA-C    CHIEF COMPLAINT       Chief Complaint   Patient presents with    Shortness of Breath     Pt states asthma flare onset 2 hrs pta.  Relates painful inspiration         HISTORY OF PRESENT ILLNESS   (Location/Symptom, Timing/Onset, Context/Setting, Quality, Duration, Modifying Factors, Severity)  Note limiting factors.   [name] [name] is a 21 y.o. female who presents to the emergency department      21 y/o AAF with asthma exacerbation.  She has a nebulizer machine at home, but does not have the tubing attachments, mask, or medications.  Denies fever.  States that this feels like a typical exacerbation.     The history is provided by the patient.     Nursing Notes were reviewed.    REVIEW OF SYSTEMS    (2-9 systems for level 4, 10 or more for level 5)     Review of Systems   Constitutional: Negative.    HENT: Negative.     Respiratory:  Positive for cough, shortness of breath and wheezing.    Cardiovascular: Negative.    Gastrointestinal: Negative.    Musculoskeletal: Negative.    Skin: Negative.    Neurological: Negative.    Psychiatric/Behavioral: Negative.       Except as noted above the remainder of the review of systems was reviewed and negative.       PAST MEDICAL HISTORY   No past medical history on file.      SURGICAL HISTORY     No past surgical history on file.      CURRENT MEDICATIONS       Previous Medications    ALBUTEROL (PROVENTIL) 2.5 MG/0.5ML NEBU NEBULIZER SOLUTION    Take 0.5 mLs by nebulization every 6 hours as needed for Wheezing    ALBUTEROL SULFATE HFA (PROVENTIL HFA) 108 (90 BASE) MCG/ACT INHALER    Inhale 2 puffs into the lungs every 6 hours as needed for Wheezing    ALBUTEROL SULFATE HFA (VENTOLIN HFA) 108 (90 BASE) MCG/ACT INHALER    Inhale 2 puffs into the lungs every 6 hours as needed for Wheezing or  Shortness of Breath       ALLERGIES     Other and Watermelon flavor    FAMILY HISTORY     No family history on file.       SOCIAL HISTORY       Social History     Socioeconomic History    Marital status: Single       SCREENINGS         Glasgow Coma Scale  Best Verbal Response: Oriented  Best Motor Response: Obeys commands                     CIWA Assessment  BP: (!) 108/55  Pulse: 97                 PHYSICAL EXAM    (up to 7 for level 4, 8 or more for level 5)     ED Triage Vitals   BP Temp Temp Source Pulse Respirations SpO2 Height Weight - Scale   07/29/21 0325 07/29/21 0325 07/29/21 0325 07/29/21 0325 07/29/21 0325 07/29/21 0325 -- 07/29/21 0326   125/83  98.5 F (36.9 C) Oral 92 18 92 %  146 lb (66.2 kg)       Physical Exam  Vitals and nursing note reviewed.   Constitutional:       General: She is not in acute distress.     Appearance: She is well-developed. She is not toxic-appearing.   HENT:      Head: Normocephalic and atraumatic.   Cardiovascular:      Rate and Rhythm: Normal rate and regular rhythm.   Pulmonary:      Effort: No respiratory distress.      Breath sounds: Decreased breath sounds and wheezing present.   Musculoskeletal:         General: Normal range of motion.   Skin:     General: Skin is warm and dry.   Neurological:      Mental Status: She is alert and oriented to person, place, and time.   Psychiatric:         Mood and Affect: Mood normal.         Behavior: Behavior normal.         EMERGENCY DEPARTMENT COURSE and DIFFERENTIAL DIAGNOSIS/MDM:   Vitals:    Vitals:    07/29/21 0325 07/29/21 0326 07/29/21 0436   BP: 125/83  (!) 108/55   Pulse: 92  97   Resp: 18  20   Temp: 98.5 F (36.9 C)     TempSrc: Oral     SpO2: 92%  97%   Weight:  66.2 kg            Medical Decision Making  Albuterol 5mg  / Atrovent 0.5mg  Neb and Prednisone 40mg  administered.  Patient reported significant relief.  O2 sat improved from 92% on arrival to 97% (after neb tx).      Risk  Prescription drug  management.          FINAL IMPRESSION      1. Moderate asthma with exacerbation, unspecified whether persistent          DISPOSITION/PLAN   DISPOSITION Decision To Discharge 07/29/2021 04:43:22 AM      PATIENT REFERRED TO:  Follow-up with your PCP    Schedule an appointment as soon as possible for a visit         DISCHARGE MEDICATIONS:  New Prescriptions    ALBUTEROL (PROVENTIL) 2.5 MG/0.5ML NEBU NEBULIZER SOLUTION    Take 1 mL by nebulization every 4 hours as needed for Wheezing or Shortness of Breath    PREDNISONE (DELTASONE) 20 MG TABLET    Take 2 tablets by mouth daily for 4 days     Controlled Substances Monitoring:     No flowsheet data found.    (Please note that portions of this note were completed with a voice recognition program.  Efforts were made to edit the dictations but occasionally words are mis-transcribed.)    Jenin Birdsall , PA-C (electronically signed)  Attending Emergency Physician           07/31/2021, PA-C  07/29/21 0448

## 2021-08-04 DIAGNOSIS — Z419 Encounter for procedure for purposes other than remedying health state, unspecified: Secondary | ICD-10-CM | POA: Diagnosis not present

## 2021-09-04 DIAGNOSIS — Z419 Encounter for procedure for purposes other than remedying health state, unspecified: Secondary | ICD-10-CM | POA: Diagnosis not present

## 2021-10-05 DIAGNOSIS — Z419 Encounter for procedure for purposes other than remedying health state, unspecified: Secondary | ICD-10-CM | POA: Diagnosis not present

## 2021-11-04 DIAGNOSIS — Z419 Encounter for procedure for purposes other than remedying health state, unspecified: Secondary | ICD-10-CM | POA: Diagnosis not present

## 2021-12-05 DIAGNOSIS — Z419 Encounter for procedure for purposes other than remedying health state, unspecified: Secondary | ICD-10-CM | POA: Diagnosis not present

## 2022-01-04 DIAGNOSIS — Z419 Encounter for procedure for purposes other than remedying health state, unspecified: Secondary | ICD-10-CM | POA: Diagnosis not present

## 2022-02-04 DIAGNOSIS — Z419 Encounter for procedure for purposes other than remedying health state, unspecified: Secondary | ICD-10-CM | POA: Diagnosis not present

## 2022-03-07 DIAGNOSIS — Z419 Encounter for procedure for purposes other than remedying health state, unspecified: Secondary | ICD-10-CM | POA: Diagnosis not present

## 2022-04-05 DIAGNOSIS — Z419 Encounter for procedure for purposes other than remedying health state, unspecified: Secondary | ICD-10-CM | POA: Diagnosis not present

## 2022-05-06 DIAGNOSIS — Z419 Encounter for procedure for purposes other than remedying health state, unspecified: Secondary | ICD-10-CM | POA: Diagnosis not present

## 2022-06-05 DIAGNOSIS — Z419 Encounter for procedure for purposes other than remedying health state, unspecified: Secondary | ICD-10-CM | POA: Diagnosis not present

## 2022-07-06 DIAGNOSIS — Z419 Encounter for procedure for purposes other than remedying health state, unspecified: Secondary | ICD-10-CM | POA: Diagnosis not present

## 2022-07-23 ENCOUNTER — Inpatient Hospital Stay
Admit: 2022-07-23 | Discharge: 2022-07-23 | Disposition: A | Discharge status: Home / Self Care | Payer: MEDICAID | Attending: Emergency Medicine

## 2022-07-23 DIAGNOSIS — J45901 Unspecified asthma with (acute) exacerbation: Secondary | ICD-10-CM

## 2022-07-23 MED ORDER — PREDNISONE 20 MG PO TABS
20 | Freq: Once | ORAL | Status: AC
Start: 2022-07-23 — End: 2022-07-23
  Administered 2022-07-23: 05:00:00 60 mg via ORAL

## 2022-07-23 MED ORDER — PREDNISONE 20 MG PO TABS
20 | ORAL_TABLET | Freq: Every day | ORAL | 0 refills | Status: AC
Start: 2022-07-23 — End: 2022-07-27

## 2022-07-23 MED ORDER — ALBUTEROL SULFATE HFA 108 (90 BASE) MCG/ACT IN AERS
108 | Freq: Once | RESPIRATORY_TRACT | Status: AC
Start: 2022-07-23 — End: 2022-07-23
  Administered 2022-07-23: 06:00:00 2 via RESPIRATORY_TRACT

## 2022-07-23 MED ORDER — IPRATROPIUM-ALBUTEROL 0.5-2.5 (3) MG/3ML IN SOLN
Freq: Once | RESPIRATORY_TRACT | Status: AC
Start: 2022-07-23 — End: 2022-07-23
  Administered 2022-07-23: 05:00:00 1 via RESPIRATORY_TRACT

## 2022-07-23 MED ORDER — ALBUTEROL SULFATE HFA 108 (90 BASE) MCG/ACT IN AERS
108 | Freq: Four times a day (QID) | RESPIRATORY_TRACT | 3 refills | Status: DC | PRN
Start: 2022-07-23 — End: 2023-12-09

## 2022-07-23 MED FILL — VENTOLIN HFA 108 (90 BASE) MCG/ACT IN AERS: 108 (90 Base) MCG/ACT | RESPIRATORY_TRACT | Qty: 8

## 2022-07-23 MED FILL — IPRATROPIUM-ALBUTEROL 0.5-2.5 (3) MG/3ML IN SOLN: RESPIRATORY_TRACT | Qty: 3

## 2022-07-23 MED FILL — PREDNISONE 20 MG PO TABS: 20 MG | ORAL | Qty: 3

## 2022-07-23 NOTE — ED Provider Notes (Signed)
[facility] EMERGENCY DEPT  EMERGENCY DEPARTMENT ENCOUNTER      Pt Name: [name] [name]  MRN: [id number]  Birthdate [birth year]  Date of evaluation: 07/23/2022  Provider: Gaylyn Lambert, MD    CHIEF COMPLAINT       Chief Complaint   Patient presents with    Asthma     Patient comes to ER for asthma symptoms that started 2 days ago but they have been getting worse.       HISTORY OF PRESENT ILLNESS    HPI    22 y.o. female here with shortness of breath.  Patient reports she has had URI symptoms over the past 2 days including cough productive of clear sputum and shortness of breath consistent with prior asthma exacerbation.  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  She reports she feels her breathing is worsened over this time.  She reports remote prior admissions for asthma, however it has been many years.  Last ED visit was about 6 months ago.  No associated fevers, chest pain, PND, orthopnea, leg swelling, palpitations, lightheadedness, syncope, recent surgery/immobilization, h/o PE/DVT.    Nursing Notes were reviewed.    REVIEW OF SYSTEMS       Review of Systems    Except as noted above the remainder of the review of systems was reviewed and negative.       PAST MEDICAL HISTORY   No past medical history on file.    SURGICAL HISTORY     No past surgical history on file.    CURRENT MEDICATIONS       Discharge Medication List as of 07/23/2022  2:21 AM          ALLERGIES     Other and Watermelon flavor    FAMILY HISTORY     No family history on file.     SOCIAL HISTORY       Social History     Socioeconomic History    Marital status: Single           PHYSICAL EXAM    (up to 7 for level 4, 8 or more for level 5)     ED Triage Vitals   BP Temp Temp src Pulse Resp SpO2 Height Weight   -- -- -- -- -- -- -- --       Physical Exam  Constitutional:       General: She is not in acute distress.     Appearance: She is not toxic-appearing.   HENT:      Head: Normocephalic and atraumatic.       Mouth/Throat:      Mouth: Mucous membranes are moist.   Eyes:      Extraocular Movements: Extraocular movements intact.      Conjunctiva/sclera: Conjunctivae normal.      Pupils: Pupils are equal, round, and reactive to light.   Cardiovascular:      Rate and Rhythm: Normal rate and regular rhythm.      Pulses: Normal pulses.   Pulmonary:      Effort: Pulmonary effort is normal.      Comments: Expiratory wheeze bilaterally with good air movement  Abdominal:      General: There is no distension.      Palpations: Abdomen is soft.      Tenderness: There is no guarding or rebound.   Musculoskeletal:      Cervical back: Normal range of motion and neck supple.  Right lower leg: No edema.      Left lower leg: No edema.   Skin:     General: Skin is warm and dry.      Capillary Refill: Capillary refill takes less than 2 seconds.      Findings: No rash.   Neurological:      General: No focal deficit present.      Mental Status: She is alert and oriented to person, place, and time.         DIAGNOSTIC RESULTS   PROCEDURES:  Unless otherwise noted below, none     Procedures    EKG: All EKG's are interpreted by the Emergency Department Physician who either signs or Co-signs this chart in the absence of a cardiologist.      RADIOLOGY:   Non-plain film images such as CT, Ultrasound and MRI are read by the radiologist. Plain radiographic images are visualized and preliminarily interpreted by the emergency physician with the below findings. All radiology studies are overread and interpreted by a radiologist:    Interpretation per the Radiologist below, if available at the time of this note:    No orders to display       ED BEDSIDE ULTRASOUND:   Performed by ED Physician - none    LABS:  Labs Reviewed - No data to display    All other labs were within normal range or not returned as of this dictation.    EMERGENCY DEPARTMENT COURSE/REASSESSMENT and MDM:   Vitals:    Vitals:    07/23/22 0103   BP: 115/73   Pulse: 91   Resp: 20    Temp: 99.6 F (37.6 C)   TempSrc: Oral   SpO2: 98%   Weight: 64.4 kg (142 lb)   Height: 1.651 m (5\' 5" )       ED Course:       MDM      22 y.o. female here with URI symptoms of the past 2 days as well as shortness of breath consistent with prior asthma exacerbations.  On exam she is well appearing, afebrile, vitals stable she does have expiratory wheezing bilaterally, however good air movement, normal oxygen saturations, no increased work of breathing.  No peripheral edema.  She feels much better on reassessment after DuoNeb.  Lungs are clear, no increased work of breathing, saturating in the 90s on room air.  Given an albuterol inhaler here.  Will give 4 additional days of prednisone.  Advised to follow up with primary care physician in 2-3 days for repeat evaluation. ED return precautions were discussed in detail. All questions were answered. Patient verbalized understanding of discharge plan and return precautions and is amenable to discharge.    CONSULTS:  None    FINAL IMPRESSION      1. Exacerbation of asthma, unspecified asthma severity, unspecified whether persistent          DISPOSITION/PLAN   DISPOSITION Decision To Discharge 07/23/2022 02:17:00 AM      PATIENT REFERRED TO:  PCP  Follow-up with your primary care provider in the next 2-3 days. Return to the ER immediately if you develop any new/worsening concerning symptoms. If you do not have a PCP, we can provide info to help arrange this.          DISCHARGE MEDICATIONS:  Discharge Medication List as of 07/23/2022  2:21 AM        START taking these medications    Details   predniSONE (DELTASONE) 20 MG tablet  Take 2 tablets by mouth daily for 4 days, Disp-8 tablet, R-0Print             Controlled Substances Monitoring:          No data to display                (Please note that portions of this note were completed with a voice recognition program.  Efforts were made to edit the dictations but occasionally words are mis-transcribed.)    Gaylyn Lambert, MD (electronically signed)  Attending Emergency Physician           Gaylyn Lambert, MD  07/23/22 [phone]

## 2022-07-23 NOTE — ED Notes (Signed)
Pt states she feels better after her nebulizer treatment

## 2022-08-05 DIAGNOSIS — Z419 Encounter for procedure for purposes other than remedying health state, unspecified: Secondary | ICD-10-CM | POA: Diagnosis not present

## 2022-09-05 DIAGNOSIS — Z419 Encounter for procedure for purposes other than remedying health state, unspecified: Secondary | ICD-10-CM | POA: Diagnosis not present

## 2022-10-06 DIAGNOSIS — Z419 Encounter for procedure for purposes other than remedying health state, unspecified: Secondary | ICD-10-CM | POA: Diagnosis not present

## 2022-10-13 ENCOUNTER — Inpatient Hospital Stay: Admit: 2022-10-13 | Discharge: 2022-10-14 | Disposition: A | Discharge status: Home / Self Care | Payer: MEDICAID

## 2022-10-13 DIAGNOSIS — Z3201 Encounter for pregnancy test, result positive: Secondary | ICD-10-CM

## 2022-10-13 DIAGNOSIS — O219 Vomiting of pregnancy, unspecified: Secondary | ICD-10-CM

## 2022-10-13 DIAGNOSIS — Z3A00 Weeks of gestation of pregnancy not specified: Secondary | ICD-10-CM | POA: Diagnosis not present

## 2022-10-13 LAB — POC URINALYSIS, CHEMISTRY
Bilirubin, Urine, POC: NEGATIVE
Blood, UA POC: NEGATIVE
Glucose, UA POC: NEGATIVE mg/dL
Ketones, Urine, POC: 15 mg/dL — AB
Leukocytes, UA: NEGATIVE
Nitrate, UA POC: NEGATIVE
Protein, Urine, POC: NEGATIVE
Specific Gravity, Urine, POC: >=1.03 — AB (ref 1.003–1.035)
Urine Urobilinogen: 2 EU/dL — AB (ref >0.2)
pH, Urine, POC: 6 (ref 4.5–8.0)

## 2022-10-13 LAB — POC PREGNANCY UR-QUAL
HCG, Urine, POC: POSITIVE
Preg Test, Ur: POSITIVE — AB

## 2022-10-13 NOTE — Discharge Instructions (Addendum)
 Please take Zofran as prescribed as needed for nausea/vomiting. Please consider eating small amounts of food throughout the day and drink plenty of water. Return to the ER for abdominal pain or vaginal bleeding.

## 2022-10-13 NOTE — ED Provider Notes (Signed)
 [facility] EMERGENCY DEPT  EMERGENCY DEPARTMENT ENCOUNTER      Pt Name: [name] [name]  MRN: [id number]  Birthdate 2000/03/30  Date of evaluation: 10/13/2022  Provider: Eva LOISE Glenn, PA-C  Provider evaluation time: 10/13/22 1929    CHIEF COMPLAINT       Chief Complaint   Patient presents with    Emesis     Pt reports she hasn't started her menstrual cycle this month and started to have nausea and vomiting. Pt reports abd cramping as well.          HISTORY OF PRESENT ILLNESS    The patient is a 22 y/o female who presents with several episodes of vomiting the past few days and friend encouraged her to come to the ER for concern of pregnancy. Patient reports having a 69-year old at home without complications in pregnancy. Patient without miscarriages/abortions. She is sexually active and not taking birth control. She has not taken medication for nausea/vomiting. She denies abdominal pain or vaginal bleeding.     The history is provided by the patient.       Nursing Notes were reviewed.    REVIEW OF SYSTEMS     Review of Systems   Constitutional: Negative.    Respiratory: Negative.  Negative for shortness of breath.    Cardiovascular: Negative.  Negative for chest pain.   Gastrointestinal:  Positive for nausea and vomiting. Negative for abdominal pain.   Genitourinary:  Negative for dysuria, hematuria and vaginal bleeding.   Musculoskeletal: Negative.    All other systems reviewed and are negative.      Except as noted above the remainder of the review of systems was reviewed and negative.     PAST MEDICAL HISTORY   No past medical history on file.    SURGICAL HISTORY     No past surgical history on file.    CURRENT MEDICATIONS       Discharge Medication List as of 10/13/2022  8:08 PM        CONTINUE these medications which have NOT CHANGED    Details   albuterol  sulfate HFA (PROVENTIL  HFA) 108 (90 Base) MCG/ACT inhaler Inhale 2 puffs into the lungs every 6 hours as needed for Wheezing, Disp-18 g, R-3Print             ALLERGIES      Other and Watermelon flavor    FAMILY HISTORY     No family history on file.     SOCIAL HISTORY       Social History     Socioeconomic History    Marital status: Single       SCREENINGS         Glasgow Coma Scale  Eye Opening: Spontaneous  Best Verbal Response: Oriented  Best Motor Response: Obeys commands  Glasgow Coma Scale Score: 15                     CIWA Assessment  BP: 112/73  Pulse: 79                 PHYSICAL EXAM       ED Triage Vitals [10/13/22 1926]   BP Systolic BP Percentile Diastolic BP Percentile Temp Temp Source Pulse Respirations SpO2   112/73 -- -- 98.1 F (36.7 C) Oral 79 15 100 %      Height Weight - Scale         1.651 m (5' 5) 64.9 kg (143 lb)  Physical Exam  Vitals and nursing note reviewed.   Constitutional:       General: She is not in acute distress.     Appearance: Normal appearance. She is not ill-appearing, toxic-appearing or diaphoretic.   HENT:      Head: Normocephalic and atraumatic.      Mouth/Throat:      Mouth: Mucous membranes are moist.   Cardiovascular:      Rate and Rhythm: Normal rate and regular rhythm.   Pulmonary:      Effort: Pulmonary effort is normal. No respiratory distress.      Breath sounds: Normal breath sounds.   Abdominal:      General: Abdomen is flat. Bowel sounds are normal. There is no distension.      Palpations: Abdomen is soft.      Tenderness: There is no abdominal tenderness. There is no guarding.   Skin:     General: Skin is warm and dry.   Neurological:      General: No focal deficit present.      Mental Status: She is alert and oriented to person, place, and time.      Cranial Nerves: No cranial nerve deficit.      Motor: No weakness.      Gait: Gait normal.         Procedures    DIAGNOSTIC RESULTS     EKG: All EKG's are interpreted by the Emergency Department Physician who either signs or Co-signs this chart in the absence of a cardiologist.    RADIOLOGY:   No orders to display       LABS:  Labs Reviewed   POC PREGNANCY UR-QUAL -  Abnormal; Notable for the following components:       Result Value    Preg Test, Ur Positive (*)     All other components within normal limits   POC URINALYSIS, CHEMISTRY - Abnormal; Notable for the following components:    Ketones, Urine, POC 15 (*)     Specific Gravity, Urine, POC >=1.030 (*)     Urine Urobilinogen 2.0 (*)     All other components within normal limits   POC PREGNANCY UR-QUAL       All other labs were within normal range or not returned as of this dictation.    EMERGENCY DEPARTMENT COURSE/REASSESSMENT and MDM:     ED Course as of 10/13/22 2016   [facility] Oct 13, 2022   1952 Pregnancy, Urine(!): Positive [JK]   1954 Appearance: Clear [JK]   1953 Ketones, Urine, POC(!): 15 [JK]   1953 Leukocytes, UA: Negative [JK]   1953 Nitrate, UA POC: Negative [JK]   1953 Urine Urobilinogen(!): 2.0 [JK]      ED Course User Index  [JK] Maryl Eva SAILOR, PA-C       MDM  Number of Diagnoses or Management Options  Diagnosis management comments: The patient is a 22 y/o female who presents with several episodes of vomiting the past few days and friend encouraged her to come to the ER for concern of pregnancy. Patient advised she is pregnant with LMP 1 month prior. To begin taking prenatal vitamins and establish an OB/GYN as referred. Given precautions in which to return.        Amount and/or Complexity of Data Reviewed  Clinical lab tests: ordered and reviewed  Tests in the medicine section of CPT: ordered and reviewed  Independent visualization of images, tracings, or specimens: yes    Risk of Complications, Morbidity,  and/or Mortality  Presenting problems: moderate  Diagnostic procedures: moderate  Management options: moderate    Patient Progress  Patient progress: improved        FINAL IMPRESSION      1. Positive pregnancy test    2. Nausea and vomiting in pregnancy          DISPOSITION/PLAN   DISPOSITION Decision To Discharge 10/13/2022 08:05:53 PM  Condition at Disposition: Stable      PATIENT REFERRED TO:  Merritt Cameron BIRCH, MD  [address] [place] [address]  [phone]    In 1 week  For futher evaluation/treatment      DISCHARGE MEDICATIONS:  Discharge Medication List as of 10/13/2022  8:08 PM        START taking these medications    Details   ondansetron  (ZOFRAN -ODT) 4 MG disintegrating tablet Take 1 tablet by mouth 4 times daily as needed for Nausea or Vomiting, Disp-20 tablet, R-0Print               (Please note that portions of this note were completed with a voice recognition program.  Efforts were made to edit the dictations but occasionally words are mis-transcribed.)    Eva LOISE Glenn, PA-C (electronically signed)  Emergency Medicine          Glenn Eva LOISE, PA-C  10/13/22 2016

## 2022-10-14 MED ORDER — ONDANSETRON 4 MG PO TBDP
4 | Freq: Once | ORAL | Status: AC
Start: 2022-10-14 — End: 2022-10-13

## 2022-10-14 MED ORDER — ONDANSETRON 4 MG PO TBDP
4 | ORAL_TABLET | Freq: Four times a day (QID) | ORAL | 0 refills | Status: AC | PRN
Start: 2022-10-14 — End: 2022-10-23

## 2022-10-14 MED ADMIN — ondansetron (ZOFRAN-ODT) disintegrating tablet 4 mg: 4 mg | ORAL | NDC 16714020010

## 2022-10-14 MED FILL — ONDANSETRON 4 MG PO TBDP: 4 MG | ORAL | Qty: 1

## 2022-11-05 DIAGNOSIS — Z419 Encounter for procedure for purposes other than remedying health state, unspecified: Secondary | ICD-10-CM | POA: Diagnosis not present

## 2022-12-06 DIAGNOSIS — Z419 Encounter for procedure for purposes other than remedying health state, unspecified: Secondary | ICD-10-CM | POA: Diagnosis not present

## 2023-01-05 DIAGNOSIS — Z419 Encounter for procedure for purposes other than remedying health state, unspecified: Secondary | ICD-10-CM | POA: Diagnosis not present

## 2023-02-05 DIAGNOSIS — Z419 Encounter for procedure for purposes other than remedying health state, unspecified: Secondary | ICD-10-CM | POA: Diagnosis not present

## 2023-02-05 NOTE — L&D Delivery Note (Signed)
 DELIVERY NOTE    [name] [name] is a 23 y.o. G2P1 admitted at [redacted]w[redacted]d in labor/SROM +meconium. Pregnancy was complicated by no prenatal care. She received an epidural for pain control and was augmented with pitocin. She progressed to 10/100/+1 and pushing ensued. As the head was delivering, [name] [name] became fatigued and pushing efforts diminished. The head delivered slowly. There was a nuchal cord. The left/anterior shoulder delivered without difficulty followed by the posterior shoulder and body. Tone was poor so the cord was quickly clamped and cut and the baby was taken to the warmer with the neonatology team. Apgars were 4/8/9. Weight 4270g pH 7.2 BE -2.3. The placenta delivered spontaneously. It appeared intact, had a 3 vessel cord and had a central cord insertion. There were no lacerations.       Tayshia, Coppes [[id number]]      Labor Events    Preterm Labor: No  Antenatal Steroids: None  Cervical Ripening Date/Time:      Antibiotics Received during Labor: No  Rupture Date/Time:  06/12/23 04:04:00   Rupture Type: SROM  Fluid Color: Meconium  Meconium Consistency: Thin  Fluid Odor: Other (Comment)  Fluid Volume: Other (Comment)  Induction: None  Augmentation: Oxytocin  Labor Complications: Meconium at birth   OB: DELIVERY - COMPLICATIONS    Nuchal cord, fetus 1          Anesthesia    Method: Epidural       Labor Event Times      Labor onset date/time:  06/12/23 04:04:00     Dilation complete date/time:  06/12/23 12:25:00     Start pushing date/time:  06/12/2023 13:32:14   Decision date/time (emergent c-section):            Labor Length    1st stage: 8h 48m  2nd stage: 1h 110m  3rd stage: 2h 99m       Delivery Details      Delivery Date: 06/12/23 Delivery Time: 13:55:00   Delivery Type: Vaginal, Spontaneous              Newborn Presentation    Presentation: Vertex  Position: Middle  _: Occiput  _: Anterior       Shoulder Dystocia    Shoulder Dystocia Present?: No       Assisted Delivery Details    Forceps Attempted?: No  Vacuum  Extractor Attempted?: No                           Cord    Vessels: 3 Vessels  Complications: Nuchal Loose  Delayed Cord Clamping?: No  Cord Blood Disposition: Lab  Gases Sent?: Yes              Placenta    Date/Time: 06/12/2023 15:58:00  Removal: Spontaneous  Appearance: Intact  Disposition: Discarded       Lacerations    Episiotomy: None  Perineal Lacerations: None  Other Lacerations: no non-perineal laceration  Number of Repair Packets: 0       Vaginal Counts    Initial Count Personnel: E LITTMAN WRIGHT CST  Initial Count Verified By: DR Erle Hawk  Intial Sponge Count: Correct Intial Needles Count: Correct Intial Instruments Count: Correct   Final Sponges Count: Correct Final Needles  Count: Correct Final Instruments Count: Correct   Final Count Personnel: DR Erle Hawk  Final Count Verified By: Kaleta Oregon RN  Accurate Final Count?: Yes       Blood Loss  Mother: Ria, Kate #  [id number]     Start of Mother's Information      Delivery Blood Loss   Intrapartum & Postpartum: 06/12/23 0404 - 06/12/23 1516    Delivery Admission: 06/12/23 0404 - 06/12/23 1516         Intrapartum & Postpartum Delivery Admission    None                  End of Mother's Information  Mother: Kindall, Doloras #[id number]                Delivery Providers    Delivering clinician: Clint Dana, MD     Provider Role     Obstetrician    Chrys Cranker, RN Primary Nurse    Donell Fuller, RN Primary Newborn Nurse     NICU Nurse     Neonatologist     Anesthesiologist     Nurse Anesthetist     Nurse Practitioner     Midwife     Nursery Nurse    Bayard Bottcher, RCP Respiratory Therapist     Scrub Tech     Assistant Surgeon    Cleatis Curio, RN Charge Nurse              Newborn Assessment    Living Status: Living        Skin Color:   Heart Rate:   Reflex Irritability:   Muscle Tone:   Respiratory Effort:   Total:            1 Minute:    0    2    1    0    1    4         5  Minute:    1    2    2    2    1    8         10  Minute: 1    2    2    2    2     9                                    Apgars Assigned By: Varney Gentleman              Resuscitation    Method: Bulb Suction, Stimulation, CPAP, Suctioning             Newborn Measurements      Birth Weight: 4270 g   Birth Length: 54.6 cm     Head Circumference: 35 cm     Chest Circumference: 35 cm     Abdominal Girth: 32 cm              Skin to Skin      Skin to Skin Initiation Date/Time: 06/12/23 14:10:00 EDT     Skin to Skin With: Mother

## 2023-03-08 DIAGNOSIS — Z419 Encounter for procedure for purposes other than remedying health state, unspecified: Secondary | ICD-10-CM | POA: Diagnosis not present

## 2023-04-05 DIAGNOSIS — Z419 Encounter for procedure for purposes other than remedying health state, unspecified: Secondary | ICD-10-CM | POA: Diagnosis not present

## 2023-04-16 NOTE — Telephone Encounter (Signed)
 SMS sent for obgyn scheduling, please assist if patient calls.

## 2023-05-17 DIAGNOSIS — Z419 Encounter for procedure for purposes other than remedying health state, unspecified: Secondary | ICD-10-CM | POA: Diagnosis not present

## 2023-06-04 ENCOUNTER — Emergency Department: Admit: 2023-06-05 | Payer: Medicaid (Managed Care)

## 2023-06-04 ENCOUNTER — Inpatient Hospital Stay
Admit: 2023-06-04 | Discharge: 2023-06-05 | Disposition: A | Discharge status: Home / Self Care | Payer: Medicaid (Managed Care)

## 2023-06-04 DIAGNOSIS — O26893 Other specified pregnancy related conditions, third trimester: Secondary | ICD-10-CM

## 2023-06-04 DIAGNOSIS — O0933 Supervision of pregnancy with insufficient antenatal care, third trimester: Secondary | ICD-10-CM

## 2023-06-04 NOTE — ED Triage Notes (Signed)
 G2P1. Pt c/o abdominal pain, s/p fall down stairs yesterday AM. She denies falling on or hitting her abdomen, denies vaginal bleeding, leaking of fluid, and reports good FM. Pt has had no pnc throughout pregnancy, went to ED for confirmation of pregnancy. Pt was given EDD of 4/28 at emergency room visit based on LMP. Patient also states that she plans to give this baby up for adoption.

## 2023-06-04 NOTE — ED Provider Notes (Signed)
 Abdominal Pain  Associated symptoms include abdominal pain. Pertinent negatives include no chest pain, no headaches and no shortness of breath.      Chief Complaint   Patient presents with   . Abdominal Pain     Patient  [name] [name] is a 23 y.o. G2P1001 with EDC Estimated Date of Delivery: 06/02/23 at [redacted]w[redacted]d weeks gestation by Patient's last menstrual period was 09/09/2022 (approximate). who presents to OBED for abd paoin s/p fall yesterday AM onto her knees. Scraped her elbow right. Did not hit her abdomen. Was just having pain in legs but today began having some adominal pain to 8.10, not at present. Pain was with walking.   Patient denies regular contractions increasing in intensity and frequency. Patient reports good fetal movement. Patient denies vaginal bleeding. Patient denies rupture of membranes. Patient denies headaches, vision changes  or right upper quadrant/epigastric pain.  pt has had nausea with her pregnancy.     Patient has not established PNC. She did not have any insurance. She has had no ultrasound or labs. Her due date was given by ED based on LMP in September. She is considering giving baby up for adoption.   PMH asthma and uterine fibroids. History of SVD no complications delivered in [place]. Patient lives here with aunt and child.     Prior to Admission Medications   Prescriptions Last Dose Informant Patient Reported? Taking?   albuterol  sulfate HFA (PROVENTIL  HFA) 108 (90 Base) MCG/ACT inhaler   No No   Sig: Inhale 2 puffs into the lungs every 6 hours as needed for Wheezing      Facility-Administered Medications: None     Past Medical History:   Diagnosis Date   . Asthma        Past Surgical History:   Procedure Laterality Date   . TONSILLECTOMY           History reviewed. No pertinent family history.    Social History     Socioeconomic History   . Marital status: Single     Spouse name: Not on file   . Number of children: Not on file   . Years of education: Not on file   . Highest  education level: Not on file   Occupational History   . Not on file   Tobacco Use   . Smoking status: Never   . Smokeless tobacco: Never   Substance and Sexual Activity   . Alcohol use: Never   . Drug use: Not Currently     Types: Marijuana Karron Pagan)   . Sexual activity: Not on file   Other Topics Concern   . Not on file   Social History Narrative   . Not on file     Social Drivers of Health     Financial Resource Strain: Not on file   Food Insecurity: Not on file   Transportation Needs: Not on file   Physical Activity: Not on file   Stress: Not on file   Social Connections: Not on file   Intimate Partner Violence: Not on file   Housing Stability: Not on file     ALLERGIES: Other and Watermelon flavoring agent (non-screening)    Review of Systems   Constitutional:  Negative for chills and fever.   HENT:  Negative for congestion and rhinorrhea.    Eyes:  Negative for visual disturbance.   Respiratory:  Negative for cough and shortness of breath.    Cardiovascular:  Negative for chest pain.  Gastrointestinal:  Positive for abdominal pain and nausea.   Genitourinary:  Negative for dysuria, hematuria, vaginal bleeding and vaginal discharge.   Musculoskeletal:  Positive for arthralgias.   Neurological:  Negative for headaches.       Vitals:    06/04/23 1943   BP: 118/66   Pulse: 97   Resp: 16   Temp: 98 F (36.7 C)   TempSrc: Oral   SpO2: 99%         Physical Exam  Vitals and nursing note reviewed.   Constitutional:       General: She is not in acute distress.  HENT:      Head: Normocephalic and atraumatic.   Eyes:      Extraocular Movements: Extraocular movements intact.   Cardiovascular:      Rate and Rhythm: Normal rate.   Pulmonary:      Effort: Pulmonary effort is normal.   Abdominal:      Comments: gravid   Musculoskeletal:      Cervical back: Normal range of motion.      Comments: SVE: 2/50/-3    Skin:     General: Skin is warm and dry.   Neurological:      General: No focal deficit present.      Mental Status: She  is alert and oriented to person, place, and time. Mental status is at baseline.   Psychiatric:         Mood and Affect: Mood normal.         Behavior: Behavior normal.         Thought Content: Thought content normal.         Judgment: Judgment normal.      Non stress test   FHTs 130  mod variability pos accelerations no decelerations  reactive   Category 1  toco contractions occ vs irritability     MDM          ED Course as of 06/04/23 2155   Wed Jun 04, 2023   2149 Ant placenta AFI 11 8 lb 2 oz vtx [JC]   2154 CBC with Auto Differential(!):    WBC 9.1   RBC 3.55(!)   Hemoglobin Quant 8.5(!)   Hematocrit 28.3(!)   MCV 79.7(!)   MCH 23.9(!)   MCHC 30.0   RDW 15.3   Platelet Count 266   MPV 11.9   NRBC Automated 0.0   NRBC Absolute 0.000   Neutrophils % 65.1   Lymphocytes 23.5   Monocytes % 8.0   Eosinophils % 2.7   Basophils % 0.2   Neutrophils Absolute 5.9   Lymphocytes Absolute 2.2   Monocytes Absolute 0.7   Eosinophils Absolute 0.3   Basophils Absolute 0.0   Immature Granulocytes % 0.5   Immature Grans (Abs) 0.05  C/w anemia [JC]      ED Course User Index  [JC] Venice Gillis, MD       Procedures    Assessment and Plan:  Active Problems:    No prenatal care in current pregnancy in third trimester    [redacted] weeks gestation of pregnancy    Abdominal pain during pregnancy, third trimester    Maternal anemia in pregnancy, antepartum  Resolved Problems:    * No resolved hospital problems. *    NST/FHT reassuring.   US  reviewed wnl  Pt with anemia hgb 8.5. recommend IV iron  here given gestational age. pt has history of anemia with last pregnancy and accepts.   Will  discharge home with schedule follow up w/ on call OB provider Dr Georgian Kirk OBGYN  Return warnings given.   Pt accepts risks of discharge and agrees to return to hospital as needed.

## 2023-06-05 LAB — HIV SCREEN: HIV Screen: NEGATIVE

## 2023-06-05 LAB — CBC WITH AUTO DIFFERENTIAL
Basophils %: 0.2 % (ref 0.0–2.0)
Basophils Absolute: 0 10*3/uL (ref 0.0–0.2)
Eosinophils %: 2.7 % (ref 0.0–7.0)
Eosinophils Absolute: 0.3 10*3/uL (ref 0.0–0.5)
Hematocrit: 28.3 % — ABNORMAL LOW (ref 34.0–47.0)
Hemoglobin: 8.5 g/dL — ABNORMAL LOW (ref 11.5–15.7)
Immature Grans (Abs): 0.05 10*3/uL (ref 0.00–0.06)
Immature Granulocytes %: 0.5 % (ref 0.0–0.6)
Lymphocytes Absolute: 2.2 10*3/uL (ref 1.0–3.2)
Lymphocytes: 23.5 % (ref 15.0–45.0)
MCH: 23.9 pg — ABNORMAL LOW (ref 27.0–34.5)
MCHC: 30 g/dL (ref 30.0–36.0)
MCV: 79.7 fL — ABNORMAL LOW (ref 81.0–99.0)
MPV: 11.9 fL (ref 7.0–12.2)
Monocytes %: 8 % (ref 4.0–12.0)
Monocytes Absolute: 0.7 10*3/uL (ref 0.3–1.0)
NRBC Absolute: 0 10*3/uL (ref 0.000–0.012)
NRBC Automated: 0 % (ref 0.0–0.2)
Neutrophils %: 65.1 % (ref 42.0–74.0)
Neutrophils Absolute: 5.9 10*3/uL (ref 1.6–7.3)
Platelets: 266 10*3/uL (ref 140–440)
RBC: 3.55 x10e6/mcL — ABNORMAL LOW (ref 3.60–5.20)
RDW: 15.3 % (ref 10.0–17.0)
WBC: 9.1 10*3/uL (ref 3.8–10.6)

## 2023-06-05 LAB — URINALYSIS
Bilirubin, Urine: NEGATIVE
Blood, Urine: NEGATIVE
Glucose, Ur: NEGATIVE mg/dL
Ketones, Urine: NEGATIVE mg/dL
Nitrite, Urine: NEGATIVE
Specific Gravity, UA: 1.02 (ref 1.003–1.035)
Urobilinogen, Urine: 1 EU/dL (ref 0.2–1.0)
pH, Urine: 7 (ref 4.5–8.0)

## 2023-06-05 LAB — RPR: RPR: NONREACTIVE

## 2023-06-05 LAB — URINE DRUG SCREEN
Amphetamine Screen, Ur: NEGATIVE
Barbiturate Screen, Ur: NEGATIVE
Benzodiazepine Screen, Urine: NEGATIVE
Cannabinoid Scrn, Ur: POSITIVE — AB
Cocaine Screen Urine: NEGATIVE
Fentanyl, Ur: NEGATIVE
Opiate Screen, Urine: NEGATIVE

## 2023-06-05 LAB — ABO/RH: ABO/Rh: B POS

## 2023-06-05 LAB — HEPATITIS C ANTIBODY: Hepatitis C Ab: NEGATIVE

## 2023-06-05 LAB — RUBELLA ANTIBODY, IGG
Rubella IgG Scr Interp: REACTIVE
Rubella IgG Scr: 351 [IU]/mL

## 2023-06-05 LAB — HIV RAPID 1&2
HIV 1/O/2 Abs, Qual: NONREACTIVE
HIV ANTIGEN: NONREACTIVE

## 2023-06-05 LAB — ANTIBODY SCREEN: Antibody Screen: NEGATIVE

## 2023-06-05 LAB — CULTURE, STREP B SCREEN, VAGINAL/RECTAL

## 2023-06-05 LAB — HEPATITIS B SURFACE ANTIGEN: Hepatitis B Surface Ag: NEGATIVE

## 2023-06-05 MED ORDER — NORMAL SALINE FLUSH 0.9 % IV SOLN
0.9 | INTRAVENOUS | Status: DC
Start: 2023-06-05 — End: 2023-06-05

## 2023-06-05 MED ORDER — SODIUM CHLORIDE 0.9 % IV SOLN
0.9 | Freq: Once | INTRAVENOUS | Status: AC
Start: 2023-06-05 — End: 2023-06-04
  Administered 2023-06-05: 03:00:00 200 mg via INTRAVENOUS

## 2023-06-05 MED FILL — MONOJECT FLUSH SYRINGE 0.9 % IV SOLN: 0.9 % | INTRAVENOUS | Qty: 10 | Fill #0

## 2023-06-05 MED FILL — VENOFER 20 MG/ML IV SOLN: 20 MG/ML | INTRAVENOUS | Qty: 10 | Fill #0

## 2023-06-06 ENCOUNTER — Encounter: Payer: Medicaid (Managed Care) | Attending: Student in an Organized Health Care Education/Training Program

## 2023-06-06 LAB — CHLAMYDIA/NEISSERIA/TRICHOMONAS NAA
Chlamydia trachomatis, NAA: NEGATIVE
Neisseria Gonorrhoeae, NAA: NEGATIVE
TRICHOMONAS VAGINALIS: NEGATIVE

## 2023-06-12 ENCOUNTER — Inpatient Hospital Stay
Admission: EM | Admit: 2023-06-12 | Discharge: 2023-06-14 | Disposition: A | Discharge status: Home / Self Care | Payer: Medicaid (Managed Care)

## 2023-06-12 DIAGNOSIS — O480 Post-term pregnancy: Principal | ICD-10-CM

## 2023-06-12 LAB — CBC WITH AUTO DIFFERENTIAL
Basophils %: 0.3 % (ref 0.0–2.0)
Basophils Absolute: 0 10*3/uL (ref 0.0–0.2)
Eosinophils %: 2.3 % (ref 0.0–7.0)
Eosinophils Absolute: 0.2 10*3/uL (ref 0.0–0.5)
Hematocrit: 29.4 % — ABNORMAL LOW (ref 34.0–47.0)
Hemoglobin: 8.6 g/dL — ABNORMAL LOW (ref 11.5–15.7)
Immature Grans (Abs): 0.03 10*3/uL (ref 0.00–0.06)
Immature Granulocytes %: 0.4 % (ref 0.0–0.6)
Lymphocytes Absolute: 2.1 10*3/uL (ref 1.0–3.2)
Lymphocytes: 27 % (ref 15.0–45.0)
MCH: 23.6 pg — ABNORMAL LOW (ref 27.0–34.5)
MCHC: 29.3 g/dL — ABNORMAL LOW (ref 30.0–36.0)
MCV: 80.5 fL — ABNORMAL LOW (ref 81.0–99.0)
MPV: 12.3 fL — ABNORMAL HIGH (ref 7.0–12.2)
Monocytes %: 9.2 % (ref 4.0–12.0)
Monocytes Absolute: 0.7 10*3/uL (ref 0.3–1.0)
NRBC Absolute: 0.02 10*3/uL — ABNORMAL HIGH (ref 0.000–0.012)
NRBC Automated: 0.3 % — ABNORMAL HIGH (ref 0.0–0.2)
Neutrophils %: 60.8 % (ref 42.0–74.0)
Neutrophils Absolute: 4.8 10*3/uL (ref 1.6–7.3)
Platelets: 267 10*3/uL (ref 140–440)
RBC: 3.65 x10e6/mcL (ref 3.60–5.20)
RDW: 17.5 % — ABNORMAL HIGH (ref 10.0–17.0)
WBC: 7.9 10*3/uL (ref 3.8–10.6)

## 2023-06-12 LAB — BLOOD GAS, ARTERIAL, CORD
Base Exc, Cord Art: -2.3 mmol/L — ABNORMAL LOW (ref <=2.0)
pCO2, Cord Art: 70.8 mmHg (ref 31.1–74.3)
pH, Cord Art: 7.202 (ref 7.150–7.430)
pO2, Cord Art: 12.4 mmHg (ref 4.0–34.0)

## 2023-06-12 LAB — URINE DRUG SCREEN
Amphetamine Screen, Ur: NEGATIVE
Barbiturate Screen, Ur: NEGATIVE
Benzodiazepine Screen, Urine: NEGATIVE
Cannabinoid Scrn, Ur: POSITIVE — AB
Cocaine Screen Urine: NEGATIVE
Fentanyl, Ur: NEGATIVE
Opiate Screen, Urine: NEGATIVE

## 2023-06-12 LAB — ANTIBODY SCREEN: Antibody Screen: NEGATIVE

## 2023-06-12 LAB — ABO/RH: ABO/Rh: B POS

## 2023-06-12 MED ORDER — ACETAMINOPHEN 325 MG PO TABS
325 | ORAL | Status: DC | PRN
Start: 2023-06-12 — End: 2023-06-12

## 2023-06-12 MED ORDER — LOPERAMIDE HCL 2 MG PO CAPS
2 | Freq: Once | ORAL | Status: AC
Start: 2023-06-12 — End: 2023-06-12
  Administered 2023-06-12: 15:00:00 2 mg via ORAL

## 2023-06-12 MED ORDER — ACETAMINOPHEN 500 MG PO TABS
500 MG | Freq: Three times a day (TID) | ORAL | Status: DC
Start: 2023-06-12 — End: 2023-06-14
  Administered 2023-06-13 – 2023-06-14 (×3): 1000 mg via ORAL

## 2023-06-12 MED ORDER — MISOPROSTOL 200 MCG PO TABS
200 MCG | ORAL | Status: DC | PRN
Start: 2023-06-12 — End: 2023-06-14

## 2023-06-12 MED ORDER — OXYCODONE-ACETAMINOPHEN 5-325 MG PO TABS
5-325 | ORAL | Status: DC | PRN
Start: 2023-06-12 — End: 2023-06-12

## 2023-06-12 MED ORDER — DEXTROSE IN LACTATED RINGERS 5 % IV SOLN
5 | INTRAVENOUS | Status: DC
Start: 2023-06-12 — End: 2023-06-12
  Administered 2023-06-12: 11:00:00 via INTRAVENOUS

## 2023-06-12 MED ORDER — EPHEDRINE SULFATE (PRESSORS) 50 MG/ML IV SOLN
50 MG/ML | Freq: Two times a day (BID) | INTRAVENOUS | Status: DC | PRN
Start: 2023-06-12 — End: 2023-06-14

## 2023-06-12 MED ORDER — FENTANYL CIT-BUPIVACAINE HCL 2-0.125 MCG/ML-% EP SOLN
2-0.125 | EPIDURAL | Status: DC
Start: 2023-06-12 — End: 2023-06-12
  Administered 2023-06-12 (×2): 12 mL/h via EPIDURAL

## 2023-06-12 MED ORDER — OXYTOCIN 0.06 UNIT/ML BOLUS FROM THE BAG (POST-PARTUM)
30 | INTRAVENOUS | Status: DC | PRN
Start: 2023-06-12 — End: 2023-06-12

## 2023-06-12 MED ORDER — NALOXONE HCL 0.4 MG/ML IJ SOLN
0.4 | INTRAMUSCULAR | Status: DC | PRN
Start: 2023-06-12 — End: 2023-06-12

## 2023-06-12 MED ORDER — ONDANSETRON HCL 4 MG/2ML IJ SOLN
4 MG/2ML | Freq: Four times a day (QID) | INTRAMUSCULAR | Status: DC | PRN
Start: 2023-06-12 — End: 2023-06-14

## 2023-06-12 MED ORDER — LACTATED RINGERS IV BOLUS
INTRAVENOUS | Status: DC | PRN
Start: 2023-06-12 — End: 2023-06-12
  Administered 2023-06-12: 17:00:00 500 mL via INTRAVENOUS

## 2023-06-12 MED ORDER — CARBOPROST TROMETHAMINE 250 MCG/ML IM SOLN
250 MCG/ML | INTRAMUSCULAR | Status: DC | PRN
Start: 2023-06-12 — End: 2023-06-14

## 2023-06-12 MED ORDER — IBUPROFEN 800 MG PO TABS
800 | Freq: Three times a day (TID) | ORAL | Status: DC
Start: 2023-06-12 — End: 2023-06-12

## 2023-06-12 MED ORDER — MISOPROSTOL 100 MCG PO TABS
100 | ORAL | Status: DC
Start: 2023-06-12 — End: 2023-06-12

## 2023-06-12 MED ORDER — LACTATED RINGERS IV BOLUS
INTRAVENOUS | Status: DC | PRN
Start: 2023-06-12 — End: 2023-06-12
  Administered 2023-06-12: 10:00:00 800 mL via INTRAVENOUS

## 2023-06-12 MED ORDER — OXYCODONE HCL 5 MG PO TABS
5 MG | ORAL | Status: DC | PRN
Start: 2023-06-12 — End: 2023-06-14

## 2023-06-12 MED ORDER — EPHEDRINE SULFATE (PRESSORS) 50 MG/ML IV SOLN
50 | Freq: Once | INTRAVENOUS | Status: DC | PRN
Start: 2023-06-12 — End: 2023-06-12

## 2023-06-12 MED ORDER — BENZOCAINE-MENTHOL 20-0.5 % EX AERO
20-0.5 | CUTANEOUS | Status: DC | PRN
Start: 2023-06-12 — End: 2023-06-12

## 2023-06-12 MED ORDER — OXYTOCIN 30 UNITS IN 500 ML INFUSION
30 | INTRAVENOUS | Status: DC
Start: 2023-06-12 — End: 2023-06-12
  Administered 2023-06-12: 13:00:00 2 m[IU]/min via INTRAVENOUS

## 2023-06-12 MED ORDER — NORMAL SALINE FLUSH 0.9 % IV SOLN
0.9 | Freq: Two times a day (BID) | INTRAVENOUS | Status: DC
Start: 2023-06-12 — End: 2023-06-12

## 2023-06-12 MED ORDER — SODIUM CHLORIDE 0.9 % IV SOLN (MINI-BAG)
0.9 | Freq: Once | INTRAVENOUS | Status: DC | PRN
Start: 2023-06-12 — End: 2023-06-12

## 2023-06-12 MED ORDER — CARBOPROST TROMETHAMINE 250 MCG/ML IM SOLN
250 | INTRAMUSCULAR | Status: DC | PRN
Start: 2023-06-12 — End: 2023-06-12

## 2023-06-12 MED ORDER — ONDANSETRON 4 MG PO TBDP
4 MG | Freq: Four times a day (QID) | ORAL | Status: DC | PRN
Start: 2023-06-12 — End: 2023-06-14

## 2023-06-12 MED ORDER — NORMAL SALINE FLUSH 0.9 % IV SOLN
0.9 | INTRAVENOUS | Status: DC | PRN
Start: 2023-06-12 — End: 2023-06-12

## 2023-06-12 MED ORDER — SODIUM CHLORIDE 0.9 % IV SOLN (MINI-BAG)
0.9 | Freq: Once | INTRAVENOUS | Status: DC
Start: 2023-06-12 — End: 2023-06-12

## 2023-06-12 MED ORDER — OXYTOCIN 30 UNITS IN 500 ML INFUSION
30 | INTRAVENOUS | Status: DC | PRN
Start: 2023-06-12 — End: 2023-06-12
  Administered 2023-06-12: 18:00:00 333 m[IU]/min via INTRAVENOUS

## 2023-06-12 MED ORDER — EPHEDRINE SULFATE (PRESSORS) 50 MG/ML IV SOLN
50 | INTRAVENOUS | Status: AC
Start: 2023-06-12 — End: 2023-06-12

## 2023-06-12 MED ORDER — NALBUPHINE HCL 10 MG/ML IJ SOLN
10 | INTRAMUSCULAR | Status: DC | PRN
Start: 2023-06-12 — End: 2023-06-12

## 2023-06-12 MED ORDER — PHENYLEPHRINE HCL 1 MG/10ML IV SOSY
1 | INTRAVENOUS | Status: DC | PRN
Start: 2023-06-12 — End: 2023-06-12

## 2023-06-12 MED ORDER — SIMETHICONE 80 MG PO CHEW
80 MG | Freq: Four times a day (QID) | ORAL | Status: DC | PRN
Start: 2023-06-12 — End: 2023-06-14

## 2023-06-12 MED ORDER — BENZOCAINE-MENTHOL 20-0.5 % EX AERO
20-0.5 % | CUTANEOUS | Status: DC | PRN
Start: 2023-06-12 — End: 2023-06-14

## 2023-06-12 MED ORDER — BISACODYL 10 MG RE SUPP
10 MG | Freq: Every day | RECTAL | Status: DC | PRN
Start: 2023-06-12 — End: 2023-06-14

## 2023-06-12 MED ORDER — SODIUM CHLORIDE 0.9 % IV SOLN
0.9 % | INTRAVENOUS | Status: DC | PRN
Start: 2023-06-12 — End: 2023-06-14

## 2023-06-12 MED ORDER — AMPICILLIN SODIUM 1 G IJ SOLR (MIXTURES ONLY)
1 | INTRAVENOUS | Status: DC
Start: 2023-06-12 — End: 2023-06-12

## 2023-06-12 MED ORDER — NORMAL SALINE FLUSH 0.9 % IV SOLN
0.9 % | Freq: Two times a day (BID) | INTRAVENOUS | Status: DC
Start: 2023-06-12 — End: 2023-06-14
  Administered 2023-06-13: 10 mL via INTRAVENOUS

## 2023-06-12 MED ORDER — ONDANSETRON HCL 4 MG/2ML IJ SOLN
4 | Freq: Four times a day (QID) | INTRAMUSCULAR | Status: DC | PRN
Start: 2023-06-12 — End: 2023-06-12
  Administered 2023-06-12: 15:00:00 4 mg via INTRAVENOUS

## 2023-06-12 MED ORDER — POLYETHYLENE GLYCOL 3350 17 G PO PACK
17 g | Freq: Every day | ORAL | Status: DC | PRN
Start: 2023-06-12 — End: 2023-06-14

## 2023-06-12 MED ORDER — SODIUM CHLORIDE 0.9 % IV SOLN
0.9 | INTRAVENOUS | Status: DC | PRN
Start: 2023-06-12 — End: 2023-06-12

## 2023-06-12 MED ORDER — TETANUS-DIPHTH-ACELL PERTUSSIS 5-2.5-18.5 LF-MCG/0.5 IM SUSY
5-2.5-18.5 LF-MCG/0.5 | INTRAMUSCULAR | Status: DC
Start: 2023-06-12 — End: 2023-06-14

## 2023-06-12 MED ORDER — NORMAL SALINE FLUSH 0.9 % IV SOLN
0.9 % | INTRAVENOUS | Status: DC | PRN
Start: 2023-06-12 — End: 2023-06-14

## 2023-06-12 MED ORDER — MISOPROSTOL 200 MCG PO TABS
200 | ORAL | Status: DC | PRN
Start: 2023-06-12 — End: 2023-06-12

## 2023-06-12 MED ORDER — METHYLERGONOVINE MALEATE 0.2 MG/ML IJ SOLN
0.2 MG/ML | INTRAMUSCULAR | Status: DC | PRN
Start: 2023-06-12 — End: 2023-06-14

## 2023-06-12 MED ORDER — WITCH HAZEL-GLYCERIN EX PADS
CUTANEOUS | Status: DC | PRN
Start: 2023-06-12 — End: 2023-06-14

## 2023-06-12 MED ORDER — BUPIVACAINE HCL (PF) 0.25 % IJ SOLN
0.25 | Freq: Once | INTRAMUSCULAR | Status: DC | PRN
Start: 2023-06-12 — End: 2023-06-12
  Administered 2023-06-12: 11:00:00 5 via EPIDURAL

## 2023-06-12 MED ORDER — DEXTROSE IN LACTATED RINGERS 5 % IV SOLN
5 % | INTRAVENOUS | Status: DC
Start: 2023-06-12 — End: 2023-06-14

## 2023-06-12 MED ORDER — LIDOCAINE-EPINEPHRINE (PF) 1.5 %-1:200000 IJ SOLN
1.5 | Freq: Once | INTRAMUSCULAR | Status: DC | PRN
Start: 2023-06-12 — End: 2023-06-12
  Administered 2023-06-12: 11:00:00 3 via EPIDURAL

## 2023-06-12 MED ORDER — DIPHENOXYLATE-ATROPINE 2.5-0.025 MG PO TABS
2.5-0.025 MG | Freq: Four times a day (QID) | ORAL | Status: DC | PRN
Start: 2023-06-12 — End: 2023-06-14

## 2023-06-12 MED ORDER — ONDANSETRON 4 MG PO TBDP
4 | Freq: Four times a day (QID) | ORAL | Status: DC | PRN
Start: 2023-06-12 — End: 2023-06-12

## 2023-06-12 MED ORDER — METHYLERGONOVINE MALEATE 0.2 MG/ML IJ SOLN
0.2 | INTRAMUSCULAR | Status: DC | PRN
Start: 2023-06-12 — End: 2023-06-12

## 2023-06-12 MED ORDER — DOCUSATE SODIUM 100 MG PO CAPS
100 MG | Freq: Two times a day (BID) | ORAL | Status: DC
Start: 2023-06-12 — End: 2023-06-14
  Administered 2023-06-13 – 2023-06-14 (×4): 100 mg via ORAL

## 2023-06-12 MED ORDER — FAMOTIDINE 20 MG PO TABS
20 MG | Freq: Two times a day (BID) | ORAL | Status: DC | PRN
Start: 2023-06-12 — End: 2023-06-14

## 2023-06-12 MED ORDER — LANOLIN-PETROLATUM 15.5-53.4 % EX OINT
15.5-53.4 % | CUTANEOUS | Status: DC | PRN
Start: 2023-06-12 — End: 2023-06-14

## 2023-06-12 MED FILL — MONOJECT FLUSH SYRINGE 0.9 % IV SOLN: 0.9 % | INTRAVENOUS | Qty: 10 | Fill #0

## 2023-06-12 MED FILL — FENTANYL CIT-BUPIVACAINE HCL 2-0.125 MCG/ML-% EP SOLN: 2-0.125 MCG/ML-% | EPIDURAL | Qty: 100 | Fill #0

## 2023-06-12 MED FILL — LOPERAMIDE HCL 2 MG PO CAPS: 2 mg | ORAL | Qty: 1 | Fill #0

## 2023-06-12 MED FILL — ONDANSETRON HCL 4 MG/2ML IJ SOLN: 4 MG/2ML | INTRAMUSCULAR | Qty: 2 | Fill #0

## 2023-06-12 MED FILL — OXYTOCIN 30 UNITS IN 500 ML INFUSION: 30 UNIT/500ML | INTRAVENOUS | Qty: 500 | Fill #0

## 2023-06-12 MED FILL — DOCUSATE SODIUM 100 MG PO CAPS: 100 mg | ORAL | Qty: 1 | Fill #0

## 2023-06-12 MED FILL — DIPHENOXYLATE-ATROPINE 2.5-0.025 MG PO TABS: 2.5-0.025 mg | ORAL | Qty: 1 | Fill #0

## 2023-06-12 NOTE — Plan of Care (Signed)
 Problem: Vaginal Birth or Cesarean Section  Goal: Fetal and maternal status remain reassuring during the birth process  Description:  Birth OB-Pregnancy care plan goal which identifies if the fetal and maternal status remain reassuring during the birth process  Flowsheets (Taken 06/12/2023 0809)  Fetal and Maternal Status Remain Reassuring During the Birth Process:   Monitor vital signs   Monitor uterine activity   Monitor fetal heart rate   Monitor labor progression (Vaginal delivery)     Problem: Infection - Adult  Goal: Absence of infection at discharge  Flowsheets (Taken 06/12/2023 0809)  Absence of infection at discharge:   Assess and monitor for signs and symptoms of infection   Monitor lab/diagnostic results   Monitor all insertion sites i.e., indwelling lines, tubes and drains  Goal: Absence of infection during hospitalization  Flowsheets (Taken 06/12/2023 0809)  Absence of infection during hospitalization:   Assess and monitor for signs and symptoms of infection   Monitor lab/diagnostic results   Monitor all insertion sites i.e., indwelling lines, tubes and drains  Goal: Absence of fever/infection during anticipated neutropenic period  Flowsheets (Taken 06/12/2023 0809)  Absence of fever/infection during anticipated neutropenic period: Monitor white blood cell count     Problem: Discharge Planning  Goal: Discharge to home or other facility with appropriate resources  Flowsheets (Taken 06/12/2023 0809)  Discharge to home or other facility with appropriate resources:   Identify barriers to discharge with patient and caregiver   Arrange for needed discharge resources and transportation as appropriate   Identify discharge learning needs (meds, wound care, etc)     Problem: Chronic Conditions and Co-morbidities  Goal: Patient's chronic conditions and co-morbidity symptoms are monitored and maintained or improved  Flowsheets (Taken 06/12/2023 0809)  Care Plan - Patient's Chronic Conditions and Co-Morbidity Symptoms are  Monitored and Maintained or Improved: Monitor and assess patient's chronic conditions and comorbid symptoms for stability, deterioration, or improvement

## 2023-06-12 NOTE — H&P (Signed)
 History of Present Illness: [name] [name] is a 23 y.o. G2P1001 at [redacted]w[redacted]d gestation with Estimated Date of Delivery: 06/02/23.  Who presents to [facility] ED for complaints of contractions.     She reports good fetal movement. She denies vaginal bleeding. She denies leakage of fluid. She reports regular contractions. Denies fever, chills, nausea, vomiting or constipation.   Prenatal records reviewed.      Pregnancy Problem List:  No prenatal care  Anemia     Review of Systems     10 systems reviewed and all negative except what is in the HPI     PMH:  Past Medical History        Past Medical History:   Diagnosis Date    Asthma              PSH:  Past Surgical History         Past Surgical History:   Procedure Laterality Date    TONSILLECTOMY                SOCIAL HX:  Social History            Occupational History    Not on file   Tobacco Use    Smoking status: Never    Smokeless tobacco: Never   Substance and Sexual Activity    Alcohol use: Never    Drug use: Not Currently       Types: Marijuana [name] [name])    Sexual activity: Not on file         MEDS:  Home Medications           Prior to Admission medications    Medication Sig Start Date End Date Taking? Authorizing Provider   albuterol  sulfate HFA (PROVENTIL  HFA) 108 (90 Base) MCG/ACT inhaler Inhale 2 puffs into the lungs every 6 hours as needed for Wheezing 07/23/22     Criswell, Alvena Aurora, MD            ALLERGIES:  Allergies         Allergies   Allergen Reactions    Other Swelling       Lima Beans - swelling lips    Watermelon Flavoring Agent (Non-Screening)              EXAM:  Vital signs:   Vitals        Vitals:     06/12/23 0520 06/12/23 0528   BP: 112/75     Pulse: 90     Resp: 18     Temp: 97.9 F (36.6 C)     SpO2: 98%     Weight:   75.3 kg (166 lb)   Height:   1.651 m (5\' 5" )         Blood pressure range: BP: (112)/(75)   General: No acute distress, conversant  Lungs: Normal respiratory effort  Abdomen: Soft, gravid, nontender  Musculoskeletal: No edema  Neuro: Grossly  normal  Skin: Warm and dry  Psychiatric: Cooperative, appropriate mood and affect  -Pelvis adequate for trial of labor: Yes       FHTs: Category I strip  Uterine activity/Contractions: Regular on monitor  Membranes: intact  Cervix: 5 cm dilated, 90% effacement, -2 station  Presentation: cephalic       A/P: Ms. Baxley is a 23 y.o. G2P1001 at [redacted]w[redacted]d gestation.  FWB reassuring  2.  Labor.  Mid to labor and delivery for expectant management.  3. GBS unknown.  Will start antibiotics.  4.  Anemia.  CBC pending.  5.  No prenatal care.  Prenatal labs from 4/30 were reviewed.

## 2023-06-12 NOTE — Anesthesia Post-Procedure Evaluation (Signed)
 Department of Anesthesiology  Postprocedure Note    Patient: [name] [name]  MRN: [id number]  Birthdate: [birth year]  Date of evaluation: 06/12/2023    Procedure Summary       Date: 06/12/23 Room / Location:     Anesthesia Start: 0638 Anesthesia Stop: 1355    Procedure: Labor Analgesia Diagnosis:     Scheduled Providers:  Responsible Provider: Landa Pine, APRN - CRNA    Anesthesia Type: Epidural ASA Status: 2            Anesthesia Type: Epidural    Aldrete Phase I:      Aldrete Phase II:      Anesthesia Post Evaluation    Patient location during evaluation: bedside  Patient participation: complete - patient participated  Level of consciousness: awake and alert  Pain score: 0  Nausea & Vomiting: no nausea and no vomiting  Cardiovascular status: blood pressure returned to baseline  Respiratory status: acceptable  Hydration status: stable  Multimodal analgesia pain management approach  Pain management: adequate        No notable events documented.

## 2023-06-12 NOTE — Anesthesia Pre-Procedure Evaluation (Signed)
 Department of Anesthesiology  Preprocedure Note       Name:  [name] [name]   Age:  23 y.o.  DOB:  01/01/2001                                          MRN:  [id number]         Date:  06/12/2023      Surgeon: * No surgeons listed *    Procedure: * No procedures listed *    Medications prior to admission:   Prior to Admission medications    Medication Sig Start Date End Date Taking? Authorizing Provider   albuterol  sulfate HFA (PROVENTIL  HFA) 108 (90 Base) MCG/ACT inhaler Inhale 2 puffs into the lungs every 6 hours as needed for Wheezing 07/23/22   Criswell, Alvena Aurora, MD       Current medications:    Current Facility-Administered Medications   Medication Dose Route Frequency Provider Last Rate Last Admin   . miSOPROStol (CYTOTEC) pre-split tablet TABS 50 mcg  50 mcg Oral 6 times per day Twanda Gala, MD       . dextrose 5 % in lactated ringers infusion   IntraVENous Continuous Twanda Gala, MD       . lactated ringers bolus 500 mL  500 mL IntraVENous PRN Twanda Gala, MD        Or   . lactated ringers bolus 1,000 mL  1,000 mL IntraVENous PRN Twanda Gala, MD       . sodium chloride  flush 0.9 % injection 5-40 mL  5-40 mL IntraVENous 2 times per day Twanda Gala, MD       . sodium chloride  flush 0.9 % injection 5-40 mL  5-40 mL IntraVENous PRN Twanda Gala, MD       . 0.9 % sodium chloride  infusion   IntraVENous PRN Twanda Gala, MD       . nalbuphine (NUBAIN) injection 5 mg  5 mg IntraVENous Q3H PRN Twanda Gala, MD       . ondansetron  (ZOFRAN ) injection 4 mg  4 mg IntraVENous Q6H PRN Twanda Gala, MD        Or   . ondansetron  (ZOFRAN -ODT) disintegrating tablet 4 mg  4 mg Oral Q6H PRN Twanda Gala, MD       . oxytocin (PITOCIN) 30 units in 500 mL infusion  333 milli-units/min IntraVENous Continuous PRN Twanda Gala, MD        And   . oxytocin (PITOCIN) 10 unit bolus from the bag  10 Units IntraVENous PRN Twanda Gala, MD       . methylergonovine (METHERGINE) injection 200 mcg  200 mcg  IntraMUSCular PRN Twanda Gala, MD       . carboprost (HEMABATE) injection 250 mcg  250 mcg IntraMUSCular PRN Twanda Gala, MD       . miSOPROStol (CYTOTEC) tablet 400 mcg  400 mcg Buccal PRN Twanda Gala, MD       . tranexamic acid (CYKLOKAPRON) 1,000 mg in sodium chloride  0.9 % 110 mL IVPB (mini-bag)  1,000 mg IntraVENous Once PRN Twanda Gala, MD       . acetaminophen (TYLENOL) tablet 650 mg  650 mg Oral Q4H PRN Twanda Gala, MD       . oxyCODONE-acetaminophen (PERCOCET) 5-325 MG per tablet 1 tablet  1 tablet Oral Q4H PRN Twanda Gala, MD        Or   . oxyCODONE-acetaminophen (PERCOCET) 5-325 MG per tablet 2 tablet  2 tablet Oral Q4H PRN Twanda Gala, MD       . benzocaine-menthol (DERMOPLAST) 20-0.5 % spray   Topical PRN Twanda Gala, MD       . ampicillin (OMNIPEN) 2,000 mg in sodium chloride  0.9 % 100 mL IVPB (mini-bag)  2,000 mg IntraVENous Once Twanda Gala, MD        Followed by   . ampicillin (OMNIPEN) 1,000 mg in sodium chloride  0.9 % 100 mL IVPB (mini-bag)  1,000 mg IntraVENous Q4H Twanda Gala, MD       . ePHEDrine injection 25 mg  25 mg IntraMUSCular BID PRN Landa Pine, APRN - CRNA       . ePHEDrine injection 10 mg  10 mg IntraVENous Q10 Min Mitcheal Sweetin, Alesia Anchors, APRN - CRNA       . phenylephrine (NEO-SYNEPHRINE) 1 MG/10ML prefilled syringe (Push Dose) 0.1 mg  100 mcg IntraVENous PRN Karna Abed, Alesia Anchors, APRN - CRNA       . naloxone 0.4 mg in 10 mL sodium chloride  syringe   IntraVENous PRN Roan Sawchuk, Alesia Anchors, APRN - CRNA       . fentaNYL 2 mcg/mL BUPivacaine 0.125% in sodium chloride  0.9% 100 mL epidural infusion  12 mL/hr Epidural Continuous Cezar Misiaszek, Alesia Anchors, APRN - CRNA       . ePHEDrine injection 10 mg  10 mg IntraVENous Once PRN Landa Pine, APRN - CRNA           Allergies:    Allergies   Allergen Reactions   . Other Swelling     Lima Beans - swelling lips   . Watermelon Flavoring Agent (Non-Screening)         Problem List:    Patient Active Problem List   Diagnosis Code   . No prenatal care in current pregnancy in third trimester O09.33   . [redacted] weeks gestation of pregnancy Z3A.40   . Abdominal pain during pregnancy, third trimester O26.893, R10.9   . Maternal anemia in pregnancy, antepartum O99.019   . Encounter for vaginal delivery O80       Past Medical History:        Diagnosis Date   . Asthma        Past Surgical History:        Procedure Laterality Date   . TONSILLECTOMY         Social History:    Social History     Tobacco Use   . Smoking status: Never   . Smokeless tobacco: Never   Substance Use Topics   . Alcohol use: Never                                Counseling given: Not Answered      Vital Signs (Current):   Vitals:    06/12/23 0520 06/12/23 0528   BP: 112/75    Pulse: 90    Resp: 18    Temp: 36.6 C (97.9 F)    SpO2: 98%    Weight:  75.3 kg (166 lb)   Height:  1.651 m (5\' 5" )  BP Readings from Last 3 Encounters:   06/12/23 112/75   06/04/23 118/66   10/13/22 112/73       NPO Status:                                                                                 BMI:   Wt Readings from Last 3 Encounters:   06/12/23 75.3 kg (166 lb)   10/13/22 64.9 kg (143 lb)   07/23/22 64.4 kg (142 lb)     Body mass index is 27.62 kg/m.    CBC:   Lab Results   Component Value Date/Time    WBC 9.1 06/04/2023 09:23 PM    RBC 3.55 06/04/2023 09:23 PM    HGB 8.5 06/04/2023 09:23 PM    HCT 28.3 06/04/2023 09:23 PM    MCV 79.7 06/04/2023 09:23 PM    RDW 15.3 06/04/2023 09:23 PM    PLT 266 06/04/2023 09:23 PM       CMP: No results found for: "NA", "K", "CL", "CO2", "BUN", "CREATININE", "GFRAA", "AGRATIO", "LABGLOM", "GLUCOSE", "GLU", "CALCIUM", "BILITOT", "ALKPHOS", "AST", "ALT"    POC Tests: No results for input(s): "POCGLU", "POCNA", "POCK", "POCCL", "POCBUN", "POCHEMO", "POCHCT" in the last 72 hours.    Coags: No results found for: "PROTIME", "INR", "APTT"    HCG (If  Applicable):   Lab Results   Component Value Date    PREGTESTUR Positive (A) 10/13/2022        ABGs: No results found for: "PHART", "PO2ART", "PCO2ART", "HCO3ART", "BEART", "O2SATART"     Type & Screen (If Applicable):  Lab Results   Component Value Date    ABORH B POS 06/04/2023    LABANTI Negative 06/04/2023       Drug/Infectious Status (If Applicable):  Lab Results   Component Value Date/Time    HEPCAB Negative 06/04/2023 09:23 PM       COVID-19 Screening (If Applicable): No results found for: "COVID19"        Anesthesia Evaluation  Patient summary reviewed  Airway: Mallampati: III  TM distance: >3 FB   Neck ROM: full  Mouth opening: > = 3 FB   Dental: normal exam         Pulmonary:normal exam    (+)           asthma:                            Cardiovascular:Negative CV ROS            Rhythm: regular  Rate: normal                    Neuro/Psych:   Negative Neuro/Psych ROS              GI/Hepatic/Renal: Neg GI/Hepatic/Renal ROS            Endo/Other: Negative Endo/Other ROS                    Abdominal: normal exam            Vascular: negative vascular ROS.         Other Findings:  Anesthesia Plan      epidural     ASA 2             Anesthetic plan and risks discussed with patient.                    Landa Pine, APRN - CRNA   06/12/2023

## 2023-06-12 NOTE — Plan of Care (Signed)
 Problem: Pain  Goal: Verbalizes/displays adequate comfort level or baseline comfort level  06/12/2023 1823 by Tommy Frames, RN  Outcome: Progressing  06/12/2023 1140 by Chrys Cranker, RN  Outcome: Progressing     Problem: Safety - Adult  Goal: Free from fall injury  06/12/2023 1823 by Tommy Frames, RN  Outcome: Progressing  06/12/2023 1140 by Chrys Cranker, RN  Outcome: Progressing     Problem: Vaginal Birth or Cesarean Section  Goal: Fetal and maternal status remain reassuring during the birth process  Description:  Birth OB-Pregnancy care plan goal which identifies if the fetal and maternal status remain reassuring during the birth process  06/12/2023 1823 by Tommy Frames, RN  Outcome: Progressing  06/12/2023 1140 by Chrys Cranker, RN  Outcome: Progressing  Flowsheets (Taken 06/12/2023 0809 by Larraine Plush, RN)  Fetal and Maternal Status Remain Reassuring During the Birth Process:   Monitor vital signs   Monitor uterine activity   Monitor fetal heart rate   Monitor labor progression (Vaginal delivery)  06/12/2023 0809 by Larraine Plush, RN  Flowsheets (Taken 06/12/2023 0809)  Fetal and Maternal Status Remain Reassuring During the Birth Process:   Monitor vital signs   Monitor uterine activity   Monitor fetal heart rate   Monitor labor progression (Vaginal delivery)     Problem: Infection - Adult  Goal: Absence of infection at discharge  06/12/2023 1823 by Tommy Frames, RN  Outcome: Progressing  06/12/2023 1140 by Chrys Cranker, RN  Outcome: Progressing  06/12/2023 0809 by Larraine Plush, RN  Flowsheets (Taken 06/12/2023 0809)  Absence of infection at discharge:   Assess and monitor for signs and symptoms of infection   Monitor lab/diagnostic results   Monitor all insertion sites i.e., indwelling lines, tubes and drains  Goal: Absence of infection during hospitalization  06/12/2023 1823 by Tommy Frames, RN  Outcome: Progressing  06/12/2023 1140 by Chrys Cranker, RN  Outcome: Progressing  06/12/2023 0809 by  Larraine Plush, RN  Flowsheets (Taken 06/12/2023 0809)  Absence of infection during hospitalization:   Assess and monitor for signs and symptoms of infection   Monitor lab/diagnostic results   Monitor all insertion sites i.e., indwelling lines, tubes and drains  Goal: Absence of fever/infection during anticipated neutropenic period  06/12/2023 1823 by Tommy Frames, RN  Outcome: Progressing  06/12/2023 1140 by Chrys Cranker, RN  Outcome: Progressing  06/12/2023 0809 by Larraine Plush, RN  Flowsheets (Taken 06/12/2023 0809)  Absence of fever/infection during anticipated neutropenic period: Monitor white blood cell count     Problem: Discharge Planning  Goal: Discharge to home or other facility with appropriate resources  06/12/2023 1823 by Tommy Frames, RN  Outcome: Progressing  06/12/2023 1140 by Chrys Cranker, RN  Outcome: Progressing  06/12/2023 0809 by Larraine Plush, RN  Flowsheets (Taken 06/12/2023 [phone])  Discharge to home or other facility with appropriate resources:   Identify barriers to discharge with patient and caregiver   Arrange for needed discharge resources and transportation as appropriate   Identify discharge learning needs (meds, wound care, etc)     Problem: Chronic Conditions and Co-morbidities  Goal: Patient's chronic conditions and co-morbidity symptoms are monitored and maintained or improved  06/12/2023 1823 by Tommy Frames, RN  Outcome: Progressing  06/12/2023 1140 by Chrys Cranker, RN  Outcome: Progressing  06/12/2023 0809 by Larraine Plush, RN  Flowsheets (Taken 06/12/2023 0809)  Care Plan - Patient's Chronic Conditions and Co-Morbidity Symptoms  are Monitored and Maintained or Improved: Monitor and assess patient's chronic conditions and comorbid symptoms for stability, deterioration, or improvement     Problem: Postpartum  Goal: Experiences normal postpartum course  Description:  Postpartum OB-Pregnancy care plan goal which identifies if the mother is experiencing a normal postpartum  course  Outcome: Progressing  Goal: Appropriate maternal - newborn bonding  Description:  Postpartum OB-Pregnancy care plan goal which identifies if the mother and newborn are bonding appropriately  Outcome: Progressing  Goal: Establishment of infant feeding pattern  Description:  Postpartum OB-Pregnancy care plan goal which identifies if the mother is establishing a feeding pattern with their newborn  Outcome: Progressing  Goal: Incisions, wounds, or drain sites healing without S/S of infection  Outcome: Progressing

## 2023-06-12 NOTE — Anesthesia Procedure Notes (Signed)
 Epidural Block    Patient location during procedure: OB  Start time: 06/12/2023 6:42 AM  End time: 06/12/2023 6:44 AM  Reason for block: labor epidural  Staffing  Performed: resident/CRNA   Resident/CRNA: Landa Pine, APRN - CRNA  Performed by: Landa Pine, APRN - CRNA  Authorized by: Landa Pine, APRN - CRNA    Epidural  Patient position: sitting  Prep: ChloraPrep  Patient monitoring: cardiac monitor, continuous pulse ox and frequent blood pressure checks  Approach: midline  Location: L3-4  Injection technique: LOR saline  Provider prep: mask and sterile gloves  Needle  Needle type: Tuohy   Needle gauge: 18 G  Needle length: 6 in  Catheter type: end hole  Catheter size: 22 G  Catheter at skin depth: 14 cm  Test dose: negativeCatheter Secured: tegaderm and tape  Assessment  Hemodynamics: stable  Attempts: 1  Outcomes: uncomplicated and patient tolerated procedure well  Preanesthetic Checklist  Completed: patient identified, IV checked, site marked, risks and benefits discussed, surgical/procedural consents, equipment checked, pre-op evaluation, timeout performed, anesthesia consent given, oxygen available, monitors applied/VS acknowledged, fire risk safety assessment completed and verbalized and blood product R/B/A discussed and consented

## 2023-06-12 NOTE — ED Provider Notes (Signed)
 Name: [name] [name]  DOB: Sep 18, 2000  MRN: [id number]      Reason for Presentation to [facility] ED: Contractions    History of Present Illness: [name] [name] is a 23 y.o. G2P1001 at [redacted]w[redacted]d gestation with Estimated Date of Delivery: 06/02/23.  Who presents to [facility] ED for complaints of contractions.    She reports good fetal movement. She denies vaginal bleeding. She denies leakage of fluid. She reports regular contractions. Denies fever, chills, nausea, vomiting or constipation.   Prenatal records reviewed.     Pregnancy Problem List:  No prenatal care  Anemia    Review of Systems    10 systems reviewed and all negative except what is in the HPI    PMH:  Past Medical History:   Diagnosis Date    Asthma        PSH:  Past Surgical History:   Procedure Laterality Date    TONSILLECTOMY         SOCIAL HX:  Social History     Occupational History    Not on file   Tobacco Use    Smoking status: Never    Smokeless tobacco: Never   Substance and Sexual Activity    Alcohol use: Never    Drug use: Not Currently     Types: Marijuana [name] [name])    Sexual activity: Not on file        MEDS:  Prior to Admission medications    Medication Sig Start Date End Date Taking? Authorizing Provider   albuterol  sulfate HFA (PROVENTIL  HFA) 108 (90 Base) MCG/ACT inhaler Inhale 2 puffs into the lungs every 6 hours as needed for Wheezing 07/23/22   Criswell, Alvena Aurora, MD        ALLERGIES:  Allergies   Allergen Reactions    Other Swelling     Lima Beans - swelling lips    Watermelon Flavoring Agent (Non-Screening)        EXAM:  Vital signs:   Vitals:    06/12/23 0520 06/12/23 0528   BP: 112/75    Pulse: 90    Resp: 18    Temp: 97.9 F (36.6 C)    SpO2: 98%    Weight:  75.3 kg (166 lb)   Height:  1.651 m (5\' 5" )      Blood pressure range: BP: (112)/(75)   General: No acute distress, conversant  Lungs: Normal respiratory effort  Abdomen: Soft, gravid, nontender  Musculoskeletal: No edema  Neuro: Grossly normal  Skin: Warm and dry  Psychiatric: Cooperative,  appropriate mood and affect  -Pelvis adequate for trial of labor: Yes      FHTs: Category I strip  Uterine activity/Contractions: Regular on monitor  Membranes: intact  Cervix: 5 cm dilated, 90% effacement, -2 station  Presentation: cephalic      A/P: [name] [name] is a 23 y.o. G2P1001 at [redacted]w[redacted]d gestation.  FWB reassuring  2.  Labor.  Mid to labor and delivery for expectant management.  3. GBS unknown.  Will start antibiotics.  4.  Anemia.  CBC pending.  5.  No prenatal care.  Prenatal labs from 4/30 were reviewed.

## 2023-06-13 LAB — CBC
Hematocrit: 29.6 % — ABNORMAL LOW (ref 34.0–47.0)
Hemoglobin: 8.7 g/dL — ABNORMAL LOW (ref 11.5–15.7)
MCH: 23.4 pg — ABNORMAL LOW (ref 27.0–34.5)
MCHC: 29.4 g/dL — ABNORMAL LOW (ref 30.0–36.0)
MCV: 79.6 fL — ABNORMAL LOW (ref 81.0–99.0)
MPV: 11.7 fL (ref 7.0–12.2)
NRBC Absolute: 0 10*3/uL (ref 0.000–0.012)
NRBC Automated: 0 % (ref 0.0–0.2)
Platelets: 239 10*3/uL (ref 140–440)
RBC: 3.72 x10e6/mcL (ref 3.60–5.20)
RDW: 17.1 % — ABNORMAL HIGH (ref 10.0–17.0)
WBC: 12.3 10*3/uL — ABNORMAL HIGH (ref 3.8–10.6)

## 2023-06-13 MED ORDER — SODIUM CHLORIDE 0.9 % IV SOLN (MINI-BAG)
0.9 | Freq: Once | INTRAVENOUS | Status: AC | PRN
Start: 2023-06-13 — End: 2023-06-13
  Administered 2023-06-13: 21:00:00 1000 mg via INTRAVENOUS

## 2023-06-13 MED ORDER — METHYLERGONOVINE MALEATE 0.2 MG/ML IJ SOLN
0.2 | Freq: Once | INTRAMUSCULAR | Status: AC
Start: 2023-06-13 — End: 2023-06-13
  Administered 2023-06-13: 20:00:00 200 ug via INTRAMUSCULAR

## 2023-06-13 MED ORDER — IBUPROFEN 800 MG PO TABS
800 MG | Freq: Three times a day (TID) | ORAL | Status: DC
Start: 2023-06-13 — End: 2023-06-14
  Administered 2023-06-13 – 2023-06-14 (×5): 800 mg via ORAL

## 2023-06-13 MED ORDER — METHYLERGONOVINE MALEATE 0.2 MG PO TABS
0.2 | Freq: Four times a day (QID) | ORAL | Status: AC
Start: 2023-06-13 — End: 2023-06-14
  Administered 2023-06-13 – 2023-06-14 (×4): 200 ug via ORAL

## 2023-06-13 MED FILL — IBUPROFEN 800 MG PO TABS: 800 MG | ORAL | Qty: 1 | Fill #0

## 2023-06-13 MED FILL — DOCUSATE SODIUM 100 MG PO CAPS: 100 MG | ORAL | Qty: 1 | Fill #0

## 2023-06-13 MED FILL — IBUPROFEN 800 MG PO TABS: 800 mg | ORAL | Qty: 1 | Fill #0

## 2023-06-13 MED FILL — METHYLERGONOVINE MALEATE 0.2 MG PO TABS: 0.2 MG | ORAL | Qty: 1 | Fill #0

## 2023-06-13 MED FILL — METHYLERGONOVINE MALEATE 0.2 MG/ML IJ SOLN: 0.2 MG/ML | INTRAMUSCULAR | Qty: 1 | Fill #0

## 2023-06-13 MED FILL — TRANEXAMIC ACID 1000 MG/10ML IV SOLN: 1000 MG/10ML | INTRAVENOUS | Qty: 10 | Fill #0

## 2023-06-13 MED FILL — ACETAMINOPHEN EXTRA STRENGTH 500 MG PO TABS: 500 MG | ORAL | Qty: 2 | Fill #0

## 2023-06-13 NOTE — Progress Notes (Signed)
 PPD#1    No acute events overnight. Pain minimal. Lochia  menses. Formula feeding. Tolerating PO, voiding, ambulating.     BP 111/72   Pulse 68   Temp 98 F (36.7 C) (Oral)   Resp 16   Ht 1.651 m (5\' 5" )   Wt 75.3 kg (166 lb)   LMP 09/09/2022 (Approximate)   SpO2 99%   Breastfeeding Unknown   BMI 27.62 kg/m   Constitutional: NAD  Psych: normal mood and affect  Neuro: alert and oriented  Abd: fundus firm and below the umbilicus  Ext: no edema    A&P  22 y.o. [id number] PPD#1 TSVD   - continue routine prenatal care   - no prenatal care. CM consult   - dc home tomorrow

## 2023-06-13 NOTE — Care Coordination-Inpatient (Signed)
 06/13/2023 CCM/KJM consultation requested by Lovett Ruck, Medicaid Navigator, due to report of positive cannabis screen in Mother and newborn as well as lack of prenatal care.  CCM met with NP from [facility] who advised of drug screens and Mother's intent to place the newborn for adoption with an aunt.  CCM noted POC would be to notify DSS CPS of the positive drug screens to establish a safe discharge from the hospital.  Whatever transpired after that with the adoption of the newborn would need to be under the guidance of DSS. At this point, Mother is still the primary caregiver/decision maker for the newborn.  CCM met at bedside with the patient, the father of the newborn and her aunt to address positive drug screen and DSS mandated reporting.  CCM reviewed plan of care for discharge from the hospital under DSS guidance and the Mother confirmed that she didn't want to take the baby home with her but wanted to place it with her Aunt for adoption.  CCM noted our plan was for a safe discharge from the hospital and CCM would notify DSS of the same.   CCM placed call to DSS HUB and report made for newborn testing positive for cannabis as well as Mother.  Also no prenatal care and Mother having similar issues with DSS in [place], [place] when she delivered her first child.  DSS gathered the information and will follow up.  CCM also notified that the Mother does not wish to take the baby home and that she had planned a kinship adoption with her maternal aunt, Avis Lemming. DSS will forward to local agency and further disposition pending.  Newborn:  Dillontae Gary Kapur (06/12/23)  FOB: Leslye Rast 08/25/2003          [phone]           [address] [place], [place] [address]  1/2 Sibling: Tilden Foil           DOB 02/12/2020           Resides with patient at [address] [facility]:  Maternal Aunt: Dewitte Foreman [phone]  Proposed Kinship Adoption: Maternal Wynette Heckler DOB  09/05/1977  [phone]  [address], [place], [place] [address]  Employed:  Assistant General Manager [employer]  Resides with her two minor children and her 72 year old daughter:  Sanaii Jenkins DOB 06/04/04.    SBAR to Isa Manuel, Charity fundraiser.  Further disposition pending DSS follow up.

## 2023-06-13 NOTE — Lactation Note (Signed)
 This note was copied from a baby's chart.     06/13/23 2045   Visit Information   Lactation Consult Visit Type IP Initial Consult   Visit Length 15 minutes   Reason for Visit Education  (milk suppression)   Care Plan/Breast Care   Lactation Comment patient declined breastfeeding. patient declined pumping. is formula feeding. educated about milk suppression: icepacks, supportive bra, turning her back to the shower water so the water isn't directly hitting her breasts, ibuprofen  as prescribed by her MD, cabbage leaves. encouraged to call for any questions/concerns.

## 2023-06-13 NOTE — Progress Notes (Signed)
 Laborist note:    CTSP for passing clots while in bathroom.  Patient was in the shower when I arrived.  She was complaining of cramping.    BP (!) 129/94   Pulse 77   Temp 98.6 F (37 C) (Oral)   Resp 16   Ht 1.651 m (5\' 5" )   Wt 75.3 kg (166 lb)   LMP 09/09/2022 (Approximate)   SpO2 100%   Breastfeeding Unknown   BMI 27.62 kg/m     FF and below umbilicus  Minimal blood on pad   Pelvic:  Minimal clot in LUS that I could not remove due to patient unable to tolerate exam    A:  Delayed PPH.  Patient stable at present  P:  Will check CBC.  TXA, Methergine 

## 2023-06-14 MED ORDER — FERROUS SULFATE 325 (65 FE) MG PO TABS
325 | ORAL_TABLET | Freq: Two times a day (BID) | ORAL | 2 refills | 37.50000 days | Status: AC
Start: 2023-06-14 — End: ?

## 2023-06-14 MED ORDER — IBUPROFEN 800 MG PO TABS
800 | ORAL_TABLET | Freq: Three times a day (TID) | ORAL | 0 refills | 9.00000 days | Status: AC
Start: 2023-06-14 — End: ?

## 2023-06-14 MED FILL — MONOJECT FLUSH SYRINGE 0.9 % IV SOLN: 0.9 % | INTRAVENOUS | Qty: 10 | Fill #0

## 2023-06-14 MED FILL — DOCUSATE SODIUM 100 MG PO CAPS: 100 MG | ORAL | Qty: 1 | Fill #0

## 2023-06-14 MED FILL — IBUPROFEN 800 MG PO TABS: 800 MG | ORAL | Qty: 1 | Fill #0

## 2023-06-14 MED FILL — ACETAMINOPHEN EXTRA STRENGTH 500 MG PO TABS: 500 MG | ORAL | Qty: 2 | Fill #0

## 2023-06-14 MED FILL — METHYLERGONOVINE MALEATE 0.2 MG PO TABS: 0.2 MG | ORAL | Qty: 1 | Fill #0

## 2023-06-14 NOTE — Care Coordination-Inpatient (Signed)
 06/16/2023- [place] - Updated safety plan uploaded into Media. Will update notes, if needed.     06/14/23:45 PM - [place] - Spoke with Team Lead [name] [name] with DSS. Plan has changed to baby being discharged to maternal aunt [name] [name]. DSS will be coming to visit with mom this evening to complete safety plan. Will have copy for our records. Updated staff and will continue to update notes.    06/14/2023- 5:06 PM - [place]- Staff requesting additional information pending discharge for baby. TeresaTelly [name], DSS Investigations Team Lead, stated baby would be picked up by Ms. [name] [name] but will be residing with grandmother at [address], [place], [place]. [facility] (see notes from 5/9). TeresaTelly stated this will remain with [facility] DSS. Further discussed with staff. Pt is requesting to change safety plan. Attempted to contact patient and left voicemail advising her to contact DSS CW. I also sent an email to the team, including [name] [name], as to mom's request. Also notified DSS that mom is scheduled for discharge but baby is not at this time. Waiting for additional information from DSS and will continue to update staff.  Added transportation to AVS.     06/14/2023 - 9:06 AM- [place] - Received phone call from DSS Supervisor [name] [name]. DSS CW [name] [name] met with family to complete safety plan last evening. Ms. [name] [name] (identified as sitter in below notes) will be picking up the baby. Mom can be present at discharge. DSS providing all paperwork and baby is also cleared for discharge. Updated staff. Will update as needed.      06/14/2023- MN- Made follow up call to DSS regarding below information and spoke with [facility] with their call center. She took my information is having a supervisor follow up with me this morning. I advised of the below information and provided my contact information. Notified staff. Will continue to update.      [name] [name]  [facility] Navigator      06/13/2023 CCM/KJM consultation  requested by [name] [name], Medicaid Navigator, due to report of positive cannabis screen in Mother and newborn as well as lack of prenatal care.  CCM met with NP from [facility] who advised of drug screens and Mother's intent to place the newborn for adoption with an aunt.  CCM noted POC would be to notify DSS CPS of the positive drug screens to establish a safe discharge from the hospital.  Whatever transpired after that with the adoption of the newborn would need to be under the guidance of DSS. At this point, Mother is still the primary caregiver/decision maker for the newborn.  CCM met at bedside with the patient, the father of the newborn and her aunt to address positive drug screen and DSS mandated reporting.  CCM reviewed plan of care for discharge from the hospital under DSS guidance and the Mother confirmed that she didn't want to take the baby home with her but wanted to place it with her Aunt for adoption.  CCM noted our plan was for a safe discharge from the hospital and CCM would notify DSS of the same.   CCM placed call to DSS HUB and report made for newborn testing positive for cannabis as well as Mother.  Also no prenatal care and Mother having similar issues with DSS in [place], [place] when she delivered her first child.  DSS gathered the information and will follow up.  CCM also notified that the Mother does not wish to take the baby  home and that she had planned a kinship adoption with her maternal aunt, [name] [name]. DSS will forward to local agency and further disposition pending.  Newborn:  [name] [name] (06/12/23)  FOB: [name] [name] 08/25/2003          [phone]           [address] [place], [place] [address]  1/2 Sibling: [name] [name]           DOB 02/12/2020           Resides with patient at [address] [facility]:  Maternal Aunt: [name] [name] [phone]  Proposed Kinship Adoption: Maternal [name] [name] DOB 09/05/1979  [phone]  [address] [place], [place] [address]  Employed:  Assistant [employer]  Resides with her two minor children and her 60 year old daughter:  [name] [name] DOB 06/04/04.     SBAR to [name] [name], Charity fundraiser.  Further disposition pending DSS follow up.       06/14/2023- MN- Made follow up call to DSS regarding below information and spoke with [facility] with their call center. She took my information is having a supervisor follow up with me this morning. I advised of the below information and provided my contact information. Notified staff. Will continue to update.     [name] [name]  [facility] Navigator    06/13/2023 CCM/KJM consultation requested by [name] [name], Medicaid Navigator, due to report of positive cannabis screen in Mother and newborn as well as lack of prenatal care.  CCM met with NP from [facility] who advised of drug screens and Mother's intent to place the newborn for adoption with an aunt.  CCM noted POC would be to notify DSS CPS of the positive drug screens to establish a safe discharge from the hospital.  Whatever transpired after that with the adoption of the newborn would need to be under the guidance of DSS. At this point, Mother is still the primary caregiver/decision maker for the newborn.  CCM met at bedside with the patient, the father of the newborn and her aunt to address positive drug screen and DSS mandated reporting.  CCM reviewed plan of care for discharge from the hospital under DSS guidance and the Mother confirmed that she didn't want to take the baby home with her but wanted to place it with her Aunt for adoption.  CCM noted our plan was for a safe discharge from the hospital and CCM would notify DSS of the same.   CCM placed call to DSS HUB and report made for newborn testing positive for cannabis as well as Mother.  Also no prenatal care and Mother having similar issues with DSS in [place], [place] when she delivered her first child.  DSS gathered the information and will follow up.  CCM  also notified that the Mother does not wish to take the baby home and that she had planned a kinship adoption with her maternal aunt, [name] [name]. DSS will forward to local agency and further disposition pending.  Newborn:  [name] [name] (06/12/23)  FOB: [name] [name] 08/25/2003          [phone]           [address] [place], [place] [address]  1/2 Sibling: Josiah Adonis Graham           DOB 02/12/2020           Resides [address] patient at [address]  Sitter:  Maternal Aunt: [name] [name] [phone]  Proposed Kinship Adoption: Maternal [name] [name] DOB 09/05/1979  [phone]  [address] [place], [place] [address]  Employed:  Assistant [employer]  Resides with her two minor children and her 106 year old daughter:  [name] [name] DOB 06/04/04.    SBAR to [name] [name], Charity fundraiser.  Further disposition pending DSS follow up.

## 2023-06-14 NOTE — Discharge Summary (Signed)
 Discharge summary:    History of Present Illness: [name] is a 23 y.o. G2P1001 at [redacted]w[redacted]d gestation with Estimated Date of Delivery: 06/02/23.  Who presents to [facility] ED for complaints of contractions.     She reports good fetal movement. She denies vaginal bleeding. She denies leakage of fluid. She reports regular contractions. Denies fever, chills, nausea, vomiting or constipation.   Prenatal records reviewed.      Pregnancy Problem List:  No prenatal care  Anemia      Hospital course:    Patient was admitted in labor and had an NSVD on 05/08.  UDS was positive for THC.  DSS notified.  On PPD #1 patient was noted for increased bleeding.  Exam unremarkable.  EBL 150cc.  Patient started on Methergine  series and give TXA.  She remained stable.  PPD #2 she was without complaints    BP 113/68   Pulse 64   Temp 98.3 F (36.8 C) (Oral)   Resp 16   Ht 1.651 m (5\' 5" )   Wt 75.3 kg (166 lb)   LMP 09/09/2022 (Approximate)   SpO2 99%   Breastfeeding Unknown   BMI 27.62 kg/m     Exam:    Abdomen soft and non tender  FF and non tender    A:  S/p NSVD - stable  P:  Home today.  Start FE BID.

## 2023-06-14 NOTE — Care Coordination-Inpatient (Addendum)
 06/14/23:45 PM - [place] - Spoke with Team Lead Ms. Telly Palms with DSS. Plan has changed to baby being discharged to maternal aunt Avis Lemming. DSS will be coming to visit with mom this evening to complete safety plan. Will have copy for our records. Updated staff and will continue to update notes.    06/14/2023- 5:06 PM - MN- Staff requesting additional information pending discharge for baby. Ms.Telly Palms, DSS Investigations Team Lead, stated baby would be picked up by Ms. Dewitte Foreman but will be residing with grandmother at [address], [place], [place]. [facility] (see notes from 5/9). Ms.Telly stated this will remain with [facility] DSS. Further discussed with staff. Pt is requesting to change safety plan. Attempted to contact patient and left voicemail advising her to contact DSS CW. I also sent an email to the team, including Ms. Telly Palms, as to mom's request. Also notified DSS that mom is scheduled for discharge but baby is not at this time. Waiting for additional information from DSS and will continue to update staff.  Added transportation to AVS.     06/14/2023 - 9:06 AM- [place] - Received phone call from DSS Supervisor Alise. DSS CW Roy Cordoba met with family to complete safety plan last evening. Ms. Shavon Gleba (identified as sitter in below notes) will be picking up the baby. Mom can be present at discharge. DSS providing all paperwork and baby is also cleared for discharge. Updated staff. Will update as needed.      06/14/2023- MN- Made follow up call to DSS regarding below information and spoke with [facility] with their call center. She took my information is having a supervisor follow up with me this morning. I advised of the below information and provided my contact information. Notified staff. Will continue to update.      Carnell Christian  [facility] Navigator          06/13/2023 CCM/KJM consultation requested by Lovett Ruck, Medicaid Navigator, due to report of positive cannabis screen in Mother  and newborn as well as lack of prenatal care.  CCM met with NP from [facility] who advised of drug screens and Mother's intent to place the newborn for adoption with an aunt.  CCM noted POC would be to notify DSS CPS of the positive drug screens to establish a safe discharge from the hospital.  Whatever transpired after that with the adoption of the newborn would need to be under the guidance of DSS. At this point, Mother is still the primary caregiver/decision maker for the newborn.  CCM met at bedside with the patient, the father of the newborn and her aunt to address positive drug screen and DSS mandated reporting.  CCM reviewed plan of care for discharge from the hospital under DSS guidance and the Mother confirmed that she didn't want to take the baby home with her but wanted to place it with her Aunt for adoption.  CCM noted our plan was for a safe discharge from the hospital and CCM would notify DSS of the same.   CCM placed call to DSS HUB and report made for newborn testing positive for cannabis as well as Mother.  Also no prenatal care and Mother having similar issues with DSS in [place], [place] when she delivered her first child.  DSS gathered the information and will follow up.  CCM also notified that the Mother does not wish to take the baby home and that she had planned a kinship adoption with her maternal aunt, Ardelia Beau  Geddis. DSS will forward to local agency and further disposition pending.  Newborn:  Dillontae Gary Kapur (06/12/23)  FOB: Leslye Rast 08/25/2003          [phone]           [address] [place], [place] [address]  1/2 Sibling: Tilden Foil           DOB 02/12/2020           Resides with patient at [address] [facility]:  Maternal Aunt: Dewitte Foreman [phone]  Proposed Kinship Adoption: Maternal Wynette Heckler DOB 09/05/1979  [phone]  [address] [place], [place] [address]  Employed:  Assistant [employer]  Resides  with her two minor children and her 59 year old daughter:  Sanaii Jenkins DOB 06/04/04.     SBAR to Isa Manuel, Charity fundraiser.  Further disposition pending DSS follow up.       06/14/2023- MN- Made follow up call to DSS regarding below information and spoke with [facility] with their call center. She took my information is having a supervisor follow up with me this morning. I advised of the below information and provided my contact information. Notified staff. Will continue to update.     Carnell Christian  [facility] Navigator    06/13/2023 CCM/KJM consultation requested by Lovett Ruck, Medicaid Navigator, due to report of positive cannabis screen in Mother and newborn as well as lack of prenatal care.  CCM met with NP from [facility] who advised of drug screens and Mother's intent to place the newborn for adoption with an aunt.  CCM noted POC would be to notify DSS CPS of the positive drug screens to establish a safe discharge from the hospital.  Whatever transpired after that with the adoption of the newborn would need to be under the guidance of DSS. At this point, Mother is still the primary caregiver/decision maker for the newborn.  CCM met at bedside with the patient, the father of the newborn and her aunt to address positive drug screen and DSS mandated reporting.  CCM reviewed plan of care for discharge from the hospital under DSS guidance and the Mother confirmed that she didn't want to take the baby home with her but wanted to place it with her Aunt for adoption.  CCM noted our plan was for a safe discharge from the hospital and CCM would notify DSS of the same.   CCM placed call to DSS HUB and report made for newborn testing positive for cannabis as well as Mother.  Also no prenatal care and Mother having similar issues with DSS in [place], [place] when she delivered her first child.  DSS gathered the information and will follow up.  CCM also notified that the Mother does not wish to take the baby home and that she had planned a  kinship adoption with her maternal aunt, Avis Lemming. DSS will forward to local agency and further disposition pending.  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    SBAR to Isa Manuel, Charity fundraiser.  Further disposition pending DSS follow up.

## 2023-06-14 NOTE — Discharge Instructions (Signed)
 Medicaid Zenaida Niece: [employer]   Phone: [phone], Mon-Fri, 8am-5pm  Web: [email or web address]/members/sc  What they offer: Medicaid Transportation is available for MD appts, dialysis, x-rays, lab work, drug store, or other medical appointments. Must call for a ride at least 3 days before your appointment.  Nicanor Alcon  Phone: [phone]  Web: ridecarta.com  Download: Transit app - http://www.perez.org/  What they offer: Connects [place]-area transit riders to work, school, healthcare, tourist attractions, the airport and more.  [place] [employer]  Phone: 272-637-0064/Toll Free: [phone]  Web: ridetricountylink.com  What they offer: Ardis Rowan continues to research, plan, and implement affordable service improvements that provide access to jobs, medical facilities, schools, and shopping for our rural customers in [place], [place], and [place].  Uber/Lyft  Benedetto GoadBenedetto Goad.com/us/en/ride/  Lyft: TaxHiking.com.cy  What they offer: Rideshare app for local or long-distance travel. Charges a fee for transportation.  [facility] [employer]   [employer]: 312-252-3580 Taxi: 5083246360  [employer] [place]: 515-226-9495  [facility] Taxi: 680-471-9412    Additional Resources   Healthy Connections - [facility] Medicaid  Phone: 385-032-1819   Web: TalkMeditation.is  Address: [facility]   [address], [address], [place], [place] [address]    [facility]  [address], [place], [place] [address]    [place] [address] [place], [place] [address]  What they offer: apply for insurance benefits based on income criteria and disability need   [employer]   Phone: 867-450-4858 or 570-458-4140  Web: insure-sc.org/   Address: [address], [place], [place] [address]  What they offer: Enroll and get information about health coverage options including Medicaid, Medicare, Marketplace, and private insurance.  [employer]   Phone: 211     Web: http://www.taylor-knight.info/ - Financial controller for [employer] at LAgents.no or download the [employer].   What they offer: Can connect callers to resources for food, healthcare services, mental health and other resources available in [place], [place], and [place] counties.  Healthy [employer]   Phone: 912-042-4920    Web: http://garza.org/  What they offer: Provide support to any individual searching for local, public health resources in [place], [place], or [place].  [place] [employer]  Phone: (916) 468-0892    Web: medicalance.com  What they offer: Connects people to crucial benefits - from food security and healthcare resources to financial wellness and more.   [employer] on Aging   Phone: 867-838-9425/Toll Free 458-005-0167    Web: ToysManual.co.za  What they offer: Provides resources to Older Americans seeking assistance with resources in the [employer]. Resources include Caregiver Support program, Insurance assistance, long term care information, and additional community resources.     Resources from [employer]. [facility] - Case Management, October 2023

## 2023-06-16 DIAGNOSIS — Z419 Encounter for procedure for purposes other than remedying health state, unspecified: Secondary | ICD-10-CM | POA: Diagnosis not present

## 2023-07-17 DIAGNOSIS — Z419 Encounter for procedure for purposes other than remedying health state, unspecified: Secondary | ICD-10-CM | POA: Diagnosis not present

## 2023-08-16 DIAGNOSIS — Z419 Encounter for procedure for purposes other than remedying health state, unspecified: Secondary | ICD-10-CM | POA: Diagnosis not present

## 2023-09-16 DIAGNOSIS — Z419 Encounter for procedure for purposes other than remedying health state, unspecified: Secondary | ICD-10-CM | POA: Diagnosis not present

## 2023-10-17 DIAGNOSIS — Z419 Encounter for procedure for purposes other than remedying health state, unspecified: Secondary | ICD-10-CM | POA: Diagnosis not present

## 2023-12-09 DIAGNOSIS — J4521 Mild intermittent asthma with (acute) exacerbation: Secondary | ICD-10-CM

## 2023-12-09 LAB — POC PREGNANCY UR-QUAL: HCG, Urine, POC: NEGATIVE

## 2023-12-09 NOTE — ED Provider Notes (Signed)
 "[facility] EMERGENCY DEPT  EMERGENCY DEPARTMENT ENCOUNTER      Pt Name: [name] [name]  MRN: [id number]  Birthdate 02/07/2000  Date of evaluation: 12/09/2023  Provider: DUWAINE FORBES PINAL, DO  Provider evaluation time: 12/09/23 2225    CHIEF COMPLAINT       Chief Complaint   Patient presents with    Wheezing     Pt BIBA C/O wheezing. She states her asthma has been acting up all day and her nebulizer treatment looked to have mold in it and she states she is out of all other asthma medications. EMS gave 1 albuterol  treatment en route and pt states she is feeling much better.          HISTORY OF PRESENT ILLNESS    [name] [name] presents to the emergency department for evaluation of asthma exacerbation. She reports history of asthma but di d not have inhaler at home. She notes triggers of cigarette smoke and cat dander. She was staying with her boyfriend and notes that he smokes and their neighbors have cats. Also reports mold in the house. She reports that she had been wheezing all day. She tried a hot shower and honey but nothing seemed to help. EMS reported wheezing and treated with albuterol  neb in transit. She currently reports feeling much better. She denies any fevers, vomiting. She reports having had a menstrual cycle last month but that it was late. She took a home pregnancy test that was faintly positive but then her cycle came on so she figures it was actually negative. She does have a nebulizer machine at home but does not have any solution. She does not have any rescue inhalers either    The history is provided by the patient.       Nursing Notes were reviewed.    REVIEW OF SYSTEMS     Review of Systems    Except as noted above the remainder of the review of systems was reviewed and negative.     PAST MEDICAL HISTORY     Past Medical History:   Diagnosis Date    Asthma        SURGICAL HISTORY       Past Surgical History:   Procedure Laterality Date    TONSILLECTOMY         CURRENT MEDICATIONS       Previous Medications     FERROUS SULFATE  (IRON  325) 325 (65 FE) MG TABLET    Take 1 tablet by mouth 2 times daily    IBUPROFEN  (ADVIL ;MOTRIN ) 800 MG TABLET    Take 1 tablet by mouth every 8 hours       ALLERGIES     Cat dander, Other, and Watermelon flavoring agent (non-screening)    FAMILY HISTORY     No family history on file.     SOCIAL HISTORY       Social History     Socioeconomic History    Marital status: Single   Tobacco Use    Smoking status: Never    Smokeless tobacco: Never   Substance and Sexual Activity    Alcohol use: Never    Drug use: Not Currently     Types: Marijuana Oda)     Social Drivers of Health     Food Insecurity: No Food Insecurity (06/22/2023)    Received from [facility]     Hunger Vital Sign     Within the past 12 months, you worried that your food would run  out before you got the money to buy more.: Never true     Within the past 12 months, the food you bought just didn't last and you didn't have money to get more.: Never true   Transportation Needs: No Transportation Needs (06/22/2023)    Received from [facility]     [employer] - Transportation     Lack of Transportation (Medical): No     Lack of Transportation (Non-Medical): No   Intimate Partner Violence: Not At Risk (10/13/2023)    Received from [facility]     Abuse Screen     Feels Unsafe at Home or Work/School: no     Feels Threatened by Someone: no     Does Anyone Try to Keep You From Having Contact with Others or Doing Things Outside Your Home?: no     Physical Signs of Abuse Present: no   Housing Stability: Unknown (06/22/2023)    Received from [facility]     Housing Stability Vital Sign     In the last 12 months, was there a time when you were not able to pay the mortgage or rent on time?: No     At any time in the past 12 months, were you homeless or living in a shelter (including now)?: No       SCREENINGS         Glasgow Coma Scale  Eye Opening:  Spontaneous  Best Verbal Response: Oriented  Best Motor Response: Obeys commands  Glasgow Coma Scale Score: 15                     CIWA Assessment  BP: 135/87  Pulse: 95                 PHYSICAL EXAM       ED Triage Vitals [12/09/23 2227]   BP Girls Systolic BP Percentile Girls Diastolic BP Percentile Boys Systolic BP Percentile Boys Diastolic BP Percentile Temp Temp src Pulse   135/87 -- -- -- -- -- -- 95      Respirations SpO2 Height Weight - Scale       19 100 % 1.651 m (5' 5) 75.1 kg (165 lb 9.6 oz)           Physical Exam  Constitutional:       General: She is not in acute distress.     Appearance: She is not ill-appearing, toxic-appearing or diaphoretic.   HENT:      Head: Normocephalic and atraumatic.      Nose: Nose normal.      Mouth/Throat:      Mouth: Mucous membranes are moist.   Eyes:      Conjunctiva/sclera: Conjunctivae normal.   Cardiovascular:      Rate and Rhythm: Normal rate and regular rhythm.      Pulses: Normal pulses.      Heart sounds: Normal heart sounds.   Pulmonary:      Effort: Pulmonary effort is normal.      Breath sounds: Normal breath sounds.   Abdominal:      General: There is no distension.      Palpations: Abdomen is soft.      Tenderness: There is no abdominal tenderness. There is no guarding.   Musculoskeletal:      Right lower leg: No edema.      Left lower leg: No edema.   Skin:     General: Skin  is warm and dry.      Capillary Refill: Capillary refill takes less than 2 seconds.   Neurological:      General: No focal deficit present.      Mental Status: She is alert.   Psychiatric:         Mood and Affect: Mood normal.         Behavior: Behavior normal.         Procedures    DIAGNOSTIC RESULTS     RADIOLOGY:   No orders to display       LABS:  Labs Reviewed   POC PREGNANCY UR-QUAL   POC PREGNANCY UR-QUAL       All other labs were within normal range or not returned as of this dictation.    EMERGENCY DEPARTMENT COURSE/REASSESSMENT and MDM:          Medical Decision Making  23  year old female with history of asthma presents to the ER after calling EMS for wheezing, resolved with pre hospital treatment. Considered pneumonia, pneumothorax however patient's symptoms resolved with single neb treatment, no fever and no chest pain. Given irregular menses will check urine pregnancy.     Pregnancy test negative    She is afebrile, nontoxic appearing and in no acute distress. She is not hypoxic on room air. Symptoms resolved with single neb treatment. Will continue home treatment plan with short course prednisone , albuterol  neb treatments and rescue inhaler.     Amount and/or Complexity of Data Reviewed  Labs: ordered.    Risk  Prescription drug management.          FINAL IMPRESSION      1. Mild intermittent asthma with acute exacerbation          DISPOSITION/PLAN   DISPOSITION Decision To Discharge 12/09/2023 11:03:45 PM   DISPOSITION CONDITION Stable           PATIENT REFERRED TO:  Your primary care provider  Call [phone] if you need a doctor to follow up with  Schedule an appointment as soon as possible for a visit   Return to the ER if any new or worsening symptoms      DISCHARGE MEDICATIONS:  New Prescriptions    ALBUTEROL  (PROVENTIL ) 2.5 MG/0.5ML NEBU NEBULIZER SOLUTION    Take 0.5 mLs by nebulization every 6 hours as needed for Wheezing    PREDNISONE  (DELTASONE ) 20 MG TABLET    Take 1 tablet by mouth daily for 5 days         (Please note that portions of this note were completed with a voice recognition program.  Efforts were made to edit the dictations but occasionally words are mis-transcribed.)    Denetta Fei FORBES PINAL, DO (electronically signed)  Emergency Medicine            Pinal Duwaine FORBES, DO  12/09/23 2310    "

## 2023-12-10 ENCOUNTER — Inpatient Hospital Stay
Admit: 2023-12-10 | Discharge: 2023-12-10 | Disposition: A | Discharge status: Home / Self Care | Payer: Medicaid (Managed Care) | Arrived: AM | Attending: Emergency Medicine

## 2023-12-10 LAB — POC PREGNANCY UR-QUAL: Preg Test, Ur: NEGATIVE

## 2023-12-10 MED ORDER — ALBUTEROL SULFATE HFA 108 (90 BASE) MCG/ACT IN AERS
108 | Freq: Once | RESPIRATORY_TRACT | Status: AC
Start: 2023-12-10 — End: 2023-12-09
  Administered 2023-12-10: 04:00:00 2 via RESPIRATORY_TRACT

## 2023-12-10 MED ORDER — ALBUTEROL SULFATE 2.5 MG/0.5ML IN NEBU
2.5 | Freq: Four times a day (QID) | RESPIRATORY_TRACT | 0 refills | 25.00000 days | Status: DC | PRN
Start: 2023-12-10 — End: 2024-03-09

## 2023-12-10 MED ORDER — PREDNISONE 20 MG PO TABS
20 | ORAL_TABLET | Freq: Every day | ORAL | 0 refills | Status: AC
Start: 2023-12-10 — End: 2023-12-14

## 2023-12-10 MED ORDER — PREDNISONE 20 MG PO TABS
20 | Freq: Once | ORAL | Status: AC
Start: 2023-12-10 — End: 2023-12-09
  Administered 2023-12-10: 04:00:00 20 mg via ORAL

## 2023-12-10 MED ORDER — ALBUTEROL SULFATE HFA 108 (90 BASE) MCG/ACT IN AERS
108 | Freq: Four times a day (QID) | RESPIRATORY_TRACT | 3 refills | 25.00000 days | Status: DC | PRN
Start: 2023-12-10 — End: 2024-03-09

## 2023-12-10 MED FILL — ALBUTEROL SULFATE HFA 108 (90 BASE) MCG/ACT IN AERS: 108 (90 Base) MCG/ACT | RESPIRATORY_TRACT | Qty: 8.5

## 2023-12-10 MED FILL — PREDNISONE 20 MG PO TABS: 20 mg | ORAL | Qty: 1

## 2023-12-17 DIAGNOSIS — Z419 Encounter for procedure for purposes other than remedying health state, unspecified: Secondary | ICD-10-CM | POA: Diagnosis not present

## 2024-01-20 NOTE — Telephone Encounter (Signed)
"  [facility]- Call responded, patient wasn't available. Patient could not establish care at this time. Patient was notified to callback when available.    "

## 2024-03-09 ENCOUNTER — Inpatient Hospital Stay
Admit: 2024-03-09 | Discharge: 2024-03-09 | Disposition: A | Discharge status: Home / Self Care | Payer: MEDICAID | Arrived: AM | Attending: Emergency Medicine

## 2024-03-09 DIAGNOSIS — J45909 Unspecified asthma, uncomplicated: Principal | ICD-10-CM

## 2024-03-09 MED ORDER — PREDNISONE 20 MG PO TABS
20 | ORAL_TABLET | Freq: Two times a day (BID) | ORAL | 0 refills | 10.00000 days | Status: DC
Start: 2024-03-09 — End: 2024-03-09

## 2024-03-09 MED ORDER — ALBUTEROL SULFATE 2.5 MG/0.5ML IN NEBU
2.5 | Freq: Four times a day (QID) | RESPIRATORY_TRACT | 0 refills | 25.00000 days | Status: AC | PRN
Start: 2024-03-09 — End: ?

## 2024-03-09 MED ORDER — ALBUTEROL SULFATE 2.5 MG/0.5ML IN NEBU
2.5 | Freq: Four times a day (QID) | RESPIRATORY_TRACT | 0 refills | 25.00000 days | Status: DC | PRN
Start: 2024-03-09 — End: 2024-03-09

## 2024-03-09 MED ORDER — PREDNISONE 20 MG PO TABS
20 | ORAL_TABLET | Freq: Two times a day (BID) | ORAL | 0 refills | 10.00000 days | Status: AC
Start: 2024-03-09 — End: 2024-03-14

## 2024-03-09 MED ORDER — ALBUTEROL SULFATE HFA 108 (90 BASE) MCG/ACT IN AERS
108 | Freq: Four times a day (QID) | RESPIRATORY_TRACT | 3 refills | 25.00000 days | Status: DC | PRN
Start: 2024-03-09 — End: 2024-03-09

## 2024-03-09 MED ORDER — ALBUTEROL SULFATE HFA 108 (90 BASE) MCG/ACT IN AERS
108 | Freq: Four times a day (QID) | RESPIRATORY_TRACT | 3 refills | 25.00000 days | Status: AC | PRN
Start: 2024-03-09 — End: ?
